# Patient Record
Sex: Female | Born: 1985 | Race: White | Hispanic: No | Marital: Single | Smoking: Former smoker
Health system: Southern US, Community
[De-identification: ages and names within clinical notes are randomized; demographics above are authoritative.]

## PROBLEM LIST (undated history)

## (undated) DIAGNOSIS — D069 Carcinoma in situ of cervix, unspecified: Secondary | ICD-10-CM

## (undated) DIAGNOSIS — I499 Cardiac arrhythmia, unspecified: Secondary | ICD-10-CM

## (undated) DIAGNOSIS — F1910 Other psychoactive substance abuse, uncomplicated: Secondary | ICD-10-CM

## (undated) HISTORY — DX: Carcinoma in situ of cervix, unspecified: D06.9

## (undated) HISTORY — PX: NO PAST SURGERIES: SHX2092

---

## 2009-01-18 ENCOUNTER — Ambulatory Visit: Payer: Self-pay | Admitting: Internal Medicine

## 2010-05-10 ENCOUNTER — Ambulatory Visit: Payer: Self-pay | Admitting: Internal Medicine

## 2011-10-06 ENCOUNTER — Ambulatory Visit: Payer: Self-pay

## 2011-12-23 ENCOUNTER — Ambulatory Visit: Payer: Self-pay | Admitting: Internal Medicine

## 2013-11-07 ENCOUNTER — Ambulatory Visit: Payer: Self-pay

## 2014-02-09 ENCOUNTER — Ambulatory Visit: Payer: Self-pay

## 2014-12-03 ENCOUNTER — Emergency Department: Payer: Self-pay | Admitting: Internal Medicine

## 2014-12-12 ENCOUNTER — Emergency Department: Payer: Self-pay | Admitting: Emergency Medicine

## 2015-10-28 ENCOUNTER — Emergency Department
Admission: EM | Admit: 2015-10-28 | Discharge: 2015-10-28 | Disposition: A | Discharge status: Home / Self Care | Payer: BLUE CROSS/BLUE SHIELD | Attending: Emergency Medicine | Admitting: Emergency Medicine

## 2015-10-28 ENCOUNTER — Encounter: Payer: Self-pay | Admitting: Emergency Medicine

## 2015-10-28 DIAGNOSIS — F17200 Nicotine dependence, unspecified, uncomplicated: Secondary | ICD-10-CM | POA: Diagnosis not present

## 2015-10-28 DIAGNOSIS — N762 Acute vulvitis: Secondary | ICD-10-CM | POA: Diagnosis not present

## 2015-10-28 DIAGNOSIS — N9089 Other specified noninflammatory disorders of vulva and perineum: Secondary | ICD-10-CM | POA: Diagnosis present

## 2015-10-28 MED ORDER — SULFAMETHOXAZOLE-TRIMETHOPRIM 800-160 MG PO TABS: 1.0000 | ORAL_TABLET | Freq: Two times a day (BID) | ORAL | Status: DC

## 2015-10-28 MED ORDER — OXYCODONE-ACETAMINOPHEN 5-325 MG PO TABS: 1.0000 | ORAL_TABLET | Freq: Four times a day (QID) | ORAL | Status: DC | PRN

## 2015-10-28 NOTE — ED Notes (Signed)
Presents with possible abscess area to vagina  Noted swelling about 3 days ago

## 2015-10-28 NOTE — ED Provider Notes (Signed)
[facility] Emergency Department Provider Note ____________________________________________  Time seen: Approximately 10:46 AM  I have reviewed the triage vital signs and the nursing notes.   HISTORY  Chief Complaint Abscess   HPI Isabella Owen is a 29 y.o. female who presents to the emergency department for evaluation of redness and swelling to the right labia. Symptoms started 3 days ago and are progressively worsening. She denies having a history of skin infection or abscesses. She denies fever. No relief with ibuprofen.  History reviewed. No pertinent past medical history.  There are no active problems to display for this patient.   History reviewed. No pertinent past surgical history.  Current Outpatient Rx  Name  Route  Sig  Dispense  Refill  . oxyCODONE-acetaminophen (ROXICET) 5-325 MG tablet   Oral   Take 1 tablet by mouth every 6 (six) hours as needed.   9 tablet   0   . sulfamethoxazole-trimethoprim (BACTRIM DS,SEPTRA DS) 800-160 MG tablet   Oral   Take 1 tablet by mouth 2 (two) times daily.   20 tablet   0     Allergies Ceclor and Norco  No family history on file.  Social History Social History  Substance Use Topics  . Smoking status: Current Every Day Smoker  . Smokeless tobacco: None  . Alcohol Use: No    Review of Systems   Constitutional: No fever/chills Eyes: No visual changes. ENT: No congestion or rhinorrhea Cardiovascular: Denies chest pain. Respiratory: Denies shortness of breath. Gastrointestinal: No abdominal pain.  No nausea, no vomiting.  No diarrhea.  No constipation. Genitourinary: Negative for dysuria. Musculoskeletal: Negative for back pain. Skin: swelling and tenderness to right vaginal area. Neurological: Negative for headaches, focal weakness or numbness.  10-point ROS otherwise negative.  ____________________________________________   PHYSICAL EXAM:  VITAL SIGNS: ED Triage Vitals   Enc Vitals Group     BP 10/28/15 0947 142/84 mmHg     Pulse Rate 10/28/15 0947 92     Resp 10/28/15 0947 16     Temp 10/28/15 0947 98.4 F (36.9 C)     Temp Source 10/28/15 0947 Oral     SpO2 10/28/15 0947 98 %     Weight 10/28/15 0947 130 lb (58.968 kg)     Height 10/28/15 0947  (1.702 m)     Head Cir --      Peak Flow --      Pain Score 10/28/15 0944 6     Pain Loc --      Pain Edu? --      Excl. in GC? --    Constitutional: Alert and oriented. Well appearing and in no acute distress. Eyes: Conjunctivae are normal. PERRL. EOMI. Head: Atraumatic. Nose: No congestion/rhinnorhea. Mouth/Throat: Mucous membranes are moist.  Oropharynx non-erythematous. No oral lesions. Neck: No stridor. Cardiovascular: Normal rate, regular rhythm.  Good peripheral circulation. Respiratory: Normal respiratory effort.  No retractions. Lungs CTAB. Gastrointestinal: Soft and nontender. No distention. No abdominal bruits.  Musculoskeletal: No lower extremity tenderness nor edema.  No joint effusions. Neurologic:  Normal speech and language. No gross focal neurologic deficits are appreciated. Speech is normal. No gait instability. Skin:  Mild erythema and swelling to the right labia majora with local induration. No fluctuance or focal abscess noted. Negative for petechiae.  Psychiatric: Mood and affect are normal. Speech and behavior are normal.  ____________________________________________   LABS (all labs ordered are listed, but only abnormal results are displayed)  Labs Reviewed -  No data to display ____________________________________________  EKG   ____________________________________________  RADIOLOGY  Not indicated. ____________________________________________   PROCEDURES  Procedure(s) performed:  ____________________________________________   INITIAL IMPRESSION / ASSESSMENT AND PLAN / ED COURSE  Pertinent labs & imaging results that were available during my care of  the patient were reviewed by me and considered in my medical decision making (see chart for details).  Patient was advised to take the antibiotic until finished. She was advised to follow-up with primary care or gynecology for symptoms that are not improving over the next 2 days. She was advised to return to the emergency department for symptoms that change or worsen if she is unable schedule an appointment. ____________________________________________   FINAL CLINICAL IMPRESSION(S) / ED DIAGNOSES  Final diagnoses:  Cellulitis of labia majora       Chinita PesterCari B Talha Iser, FNP 10/28/15 1505  Sharyn CreamerMark Quale, MD 10/28/15 [phone]

## 2015-10-30 ENCOUNTER — Emergency Department
Admission: EM | Admit: 2015-10-30 | Discharge: 2015-10-30 | Disposition: A | Discharge status: Home / Self Care | Payer: BLUE CROSS/BLUE SHIELD | Attending: Emergency Medicine | Admitting: Emergency Medicine

## 2015-10-30 ENCOUNTER — Encounter: Payer: Self-pay | Admitting: Emergency Medicine

## 2015-10-30 DIAGNOSIS — N764 Abscess of vulva: Secondary | ICD-10-CM | POA: Diagnosis not present

## 2015-10-30 DIAGNOSIS — F17200 Nicotine dependence, unspecified, uncomplicated: Secondary | ICD-10-CM | POA: Diagnosis not present

## 2015-10-30 DIAGNOSIS — Z792 Long term (current) use of antibiotics: Secondary | ICD-10-CM | POA: Diagnosis not present

## 2015-10-30 MED ORDER — LIDOCAINE-EPINEPHRINE (PF) 1 %-1:200000 IJ SOLN
INTRAMUSCULAR | Status: AC
  Filled 2015-10-30: qty 30

## 2015-10-30 MED ORDER — LIDOCAINE-EPINEPHRINE (PF) 2 %-1:200000 IJ SOLN: 10.0000 mL | Freq: Once | INTRAMUSCULAR | Status: DC

## 2015-10-30 MED ORDER — OXYCODONE-ACETAMINOPHEN 5-325 MG PO TABS
1.0000 | ORAL_TABLET | Freq: Once | ORAL | Status: AC
  Administered 2015-10-30: 1 via ORAL
  Filled 2015-10-30: qty 1

## 2015-10-30 MED ORDER — OXYCODONE-ACETAMINOPHEN 5-325 MG PO TABS: 1.0000 | ORAL_TABLET | ORAL | Status: DC | PRN

## 2015-10-30 NOTE — ED Notes (Signed)
Pt seen on Sunday for vaginal abscess; pt states she was instructed to come back if no improvement within two days.  Pt reports taking Septra as instructed.  No immediate distress at this time.

## 2015-10-30 NOTE — ED Notes (Signed)
Pt reports drainage from abcsess last night; pus-like, green.

## 2015-10-30 NOTE — ED Provider Notes (Signed)
[facility] Emergency Department Provider Note  ____________________________________________  Time seen: Approximately 6:23 PM  I have reviewed the triage vital signs and the nursing notes.   HISTORY  Chief Complaint Abscess  HPI Isabella Owen is a 29 y.o. female is here with complaint of abscess to her labia. Patient states she was here several days ago started on an antibiotic and something for pain. She states that she was hoping that it would go away on its own and actually after heat compresses it opened and drained green and yellow discharge. She states it continues to hurt. She was given Percocet for pain on the last visit and she has one left. Currently she rates her pain as a 9/10.  History reviewed. No pertinent past medical history.  There are no active problems to display for this patient.   History reviewed. No pertinent past surgical history.  Current Outpatient Rx  Name  Route  Sig  Dispense  Refill  . oxyCODONE-acetaminophen (PERCOCET) 5-325 MG tablet   Oral   Take 1 tablet by mouth every 4 (four) hours as needed for severe pain.   20 tablet   0   . sulfamethoxazole-trimethoprim (BACTRIM DS,SEPTRA DS) 800-160 MG tablet   Oral   Take 1 tablet by mouth 2 (two) times daily.   20 tablet   0     Allergies Ceclor and Norco  No family history on file.  Social History Social History  Substance Use Topics  . Smoking status: Current Every Day Smoker  . Smokeless tobacco: None  . Alcohol Use: Yes     Comment: occassionally     Review of Systems Constitutional: No fever/chills Cardiovascular: Denies chest pain. Respiratory: Denies shortness of breath. Gastrointestinal:  No nausea, no vomiting.   Genitourinary: Negative for dysuria. Musculoskeletal: Negative for back pain. Skin: Negative for rash. Positive for abscesses Neurological: Negative for headaches, focal weakness or numbness.  10-point ROS otherwise  negative.  ____________________________________________   PHYSICAL EXAM:  VITAL SIGNS: ED Triage Vitals  Enc Vitals Group     BP 10/30/15 1812 135/87 mmHg     Pulse Rate 10/30/15 1812 85     Resp --      Temp 10/30/15 1812 98 F (36.7 C)     Temp Source 10/30/15 1812 Oral     SpO2 10/30/15 1812 100 %     Weight 10/30/15 1812 130 lb (58.968 kg)     Height 10/30/15 1812  (1.702 m)     Head Cir --      Peak Flow --      Pain Score 10/30/15 1813 9     Pain Loc --      Pain Edu? --      Excl. in GC? --     Constitutional: Alert and oriented. Well appearing and in no acute distress. Eyes: Conjunctivae are normal. PERRL. EOMI. Head: Atraumatic. Nose: No congestion/rhinnorhea. Neck: No stridor.   Cardiovascular: Normal rate, regular rhythm. Grossly normal heart sounds.  Good peripheral circulation. Respiratory: Normal respiratory effort.  No retractions. Lungs CTAB. Gastrointestinal: Soft and nontender. No distention.  Musculoskeletal: No lower extremity tenderness nor edema.  No joint effusions. Neurologic:  Normal speech and language. No gross focal neurologic deficits are appreciated. No gait instability. Skin:  Skin is warm, dry and intact. Right anterior labia with mild erythema and marked tenderness on palpation. There is one area that appears to have drained. Psychiatric: Mood and affect are normal. Speech and  behavior are normal.  ____________________________________________   LABS (all labs ordered are listed, but only abnormal results are displayed)  Labs Reviewed - No data to display  PROCEDURES  Procedure(s) performed: INCISION AND DRAINAGE Performed by: Tommi Rumpshonda L Kylil Swopes Consent: Verbal consent obtained. Risks and benefits: risks, benefits and alternatives were discussed Type: abscess  Body area: right labia   Anesthesia: local infiltration  Incision was made with a scalpel.  Local anesthetic: lidocaine 1 % with epinephrine  Anesthetic  2  cc ction to break up loculations  Drainage: purulent  Drainage amount: Small   Packing  material: 1/4 in iodoform gauze  Patient tolerance: Patient tolerated the procedure well with no immediate complications.    Critical Care performed: No  ____________________________________________   INITIAL IMPRESSION / ASSESSMENT AND PLAN / ED COURSE  Pertinent labs & imaging results that were available during my care of the patient were reviewed by me and considered in my medical decision making (see chart for details).   patient was made aware that she needs to return to the emergency room in 2 days if packing has not already fallen out on its on. Patient is continue taking antibiotics until completely done. She is given a prescription for Percocet as well as one while in the emergency room. She is also encouraged to follow-up with her gynecologist if any continued problems. ____________________________________________   FINAL CLINICAL IMPRESSION(S) / ED DIAGNOSES  Final diagnoses:  Abscess of right genital labia      Tommi RumpsRhonda L Merrisa Skorupski, PA-C 10/30/15 1944  Darien Ramusavid W Kaminski, MD 10/30/15 2214

## 2015-10-30 NOTE — Discharge Instructions (Signed)
Abscess °An abscess (boil or furuncle) is an infected area on or under the skin. This area is filled with yellowish-white fluid (pus) and other material (debris). °HOME CARE  °· Only take medicines as told by your doctor. °· If you were given antibiotic medicine, take it as directed. Finish the medicine even if you start to feel better. °· If gauze is used, follow your doctor's directions for changing the gauze. °· To avoid spreading the infection: °· Keep your abscess covered with a bandage. °· Wash your hands well. °· Do not share personal care items, towels, or whirlpools with others. °· Avoid skin contact with others. °· Keep your skin and clothes clean around the abscess. °· Keep all doctor visits as told. °GET HELP RIGHT AWAY IF:  °· You have more pain, puffiness (swelling), or redness in the wound site. °· You have more fluid or blood coming from the wound site. °· You have muscle aches, chills, or you feel sick. °· You have a fever. °MAKE SURE YOU:  °· Understand these instructions. °· Will watch your condition. °· Will get help right away if you are not doing well or get worse. °  °This information is not intended to replace advice given to you by your health care provider. Make sure you discuss any questions you have with your health care provider. °  °Document Released: 04/28/2008 Document Revised: 05/11/2012 Document Reviewed: 01/24/2012 °Elsevier Interactive Patient Education ©2016 [employer] ° °Incision and Drainage °Incision and drainage is a procedure in which a sac-like structure (cystic structure) is opened and drained. The area to be drained usually contains material such as pus, fluid, or blood.  °LET YOUR CAREGIVER KNOW ABOUT:  °· Allergies to medicine. °· Medicines taken, including vitamins, herbs, eyedrops, over-the-counter medicines, and creams. °· Use of steroids (by mouth or creams). °· Previous problems with anesthetics or numbing medicines. °· History of bleeding problems or blood  clots. °· Previous surgery. °· Other health problems, including diabetes and kidney problems. °· Possibility of pregnancy, if this applies. °RISKS AND COMPLICATIONS °· Pain. °· Bleeding. °· Scarring. °· Infection. °BEFORE THE PROCEDURE  °You may need to have an ultrasound or other imaging tests to see how large or deep your cystic structure is. Blood tests may also be used to determine if you have an infection or how severe the infection is. You may need to have a tetanus shot. °PROCEDURE  °The affected area is cleaned with a cleaning fluid. The cyst area will then be numbed with a medicine (local anesthetic). A small incision will be made in the cystic structure. A syringe or catheter may be used to drain the contents of the cystic structure, or the contents may be squeezed out. The area will then be flushed with a cleansing solution. After cleansing the area, it is often gently packed with a gauze or another wound dressing. Once it is packed, it will be covered with gauze and tape or some other type of wound dressing.  °AFTER THE PROCEDURE  °· Often, you will be allowed to go home right after the procedure. °· You may be given antibiotic medicine to prevent or heal an infection. °· If the area was packed with gauze or some other wound dressing, you will likely need to come back in 1 to 2 days to get it removed. °· The area should heal in about 14 days. °  °This information is not intended to replace advice given to you by your health care provider.   Make sure you discuss any questions you have with your health care provider. °  °Document Released: 05/06/2001 Document Revised: 05/11/2012 Document Reviewed: 01/05/2012 °Elsevier Interactive Patient Education ©2016 [employer] ° °

## 2016-04-05 ENCOUNTER — Emergency Department
Admission: EM | Admit: 2016-04-05 | Discharge: 2016-04-06 | Disposition: A | Discharge status: Home / Self Care | Payer: BLUE CROSS/BLUE SHIELD | Attending: Emergency Medicine | Admitting: Emergency Medicine

## 2016-04-05 ENCOUNTER — Emergency Department: Payer: BLUE CROSS/BLUE SHIELD

## 2016-04-05 ENCOUNTER — Encounter: Payer: Self-pay | Admitting: Radiology

## 2016-04-05 DIAGNOSIS — Z3A12 12 weeks gestation of pregnancy: Secondary | ICD-10-CM | POA: Insufficient documentation

## 2016-04-05 DIAGNOSIS — O200 Threatened abortion: Secondary | ICD-10-CM | POA: Diagnosis not present

## 2016-04-05 DIAGNOSIS — F17200 Nicotine dependence, unspecified, uncomplicated: Secondary | ICD-10-CM | POA: Diagnosis not present

## 2016-04-05 DIAGNOSIS — O209 Hemorrhage in early pregnancy, unspecified: Secondary | ICD-10-CM | POA: Diagnosis present

## 2016-04-05 LAB — ABO/RH: ABO/RH(D): A POS

## 2016-04-05 LAB — CBC
HCT: 37 % (ref 35.0–47.0)
Hemoglobin: 12.5 g/dL (ref 12.0–16.0)
MCH: 31 pg (ref 26.0–34.0)
MCHC: 33.8 g/dL (ref 32.0–36.0)
MCV: 91.7 fL (ref 80.0–100.0)
PLATELETS: 325 10*3/uL (ref 150–440)
RBC: 4.03 MIL/uL (ref 3.80–5.20)
RDW: 12.7 % (ref 11.5–14.5)
WBC: 10.4 10*3/uL (ref 3.6–11.0)

## 2016-04-05 LAB — HCG, QUANTITATIVE, PREGNANCY: hCG, Beta Chain, Quant, S: 1974 m[IU]/mL — ABNORMAL HIGH (ref <5)

## 2016-04-05 NOTE — ED Notes (Signed)
Pt ambulatory to triage with no difficulty. Pt reports she had her LMP on 01/10/16. States her periods are very irregular. States yesterday she took a home pregnancy test that was positive. This evening around 6 pm pt started having vaginal bleeding and states she has had to change her pad about 4 times since then.

## 2016-04-05 NOTE — ED Provider Notes (Signed)
[facility] Emergency Department Provider Note  ____________________________________________    I have reviewed the triage vital signs and the nursing notes.   HISTORY  Chief Complaint Vaginal Bleeding    HPI [facility] is a 30 y.o. female who presents with complaint of vaginal bleeding. Patient reports to the (test yesterday which was positive. She reports breast tenderness over the last few weeks. Last period was on 01/10/2016. She reports her periods are typically irregular. She has had severe bleeding today which started around 6 PM. She denies dizziness. She complains of lower pelvic cramping.     No past medical history on file.  There are no active problems to display for this patient.   No past surgical history on file.  Current Outpatient Rx  Name  Route  Sig  Dispense  Refill  . oxyCODONE-acetaminophen (PERCOCET) 5-325 MG tablet   Oral   Take 1 tablet by mouth every 4 (four) hours as needed for severe pain.   20 tablet   0   . sulfamethoxazole-trimethoprim (BACTRIM DS,SEPTRA DS) 800-160 MG tablet   Oral   Take 1 tablet by mouth 2 (two) times daily.   20 tablet   0     Allergies Ceclor and Norco  No family history on file.  Social History Social History  Substance Use Topics  . Smoking status: Current Every Day Smoker  . Smokeless tobacco: Not on file  . Alcohol Use: Yes     Comment: occassionally     Review of Systems  Constitutional: Negative forDizziness Eyes: Negative for redness  Cardiovascular: Negative for chest pain Respiratory: Negative for shortness of breath. Gastrointestinal: Negative for abdominal pain Genitourinary: Negative for dysuria.Vaginal bleeding as above, pelvic cramping as above Musculoskeletal: Negative for back pain. Skin: Negative for pallor Neurological: Negative for focal weakness Psychiatric: Mild anxiety    ____________________________________________   PHYSICAL  EXAM:  VITAL SIGNS: ED Triage Vitals  Enc Vitals Group     BP 04/05/16 2135 149/89 mmHg     Pulse Rate 04/05/16 2135 94     Resp 04/05/16 2135 18     Temp 04/05/16 2135 98.4 F (36.9 C)     Temp Source 04/05/16 2135 Oral     SpO2 04/05/16 2135 100 %     Weight 04/05/16 2135 135 lb (61.236 kg)     Height 04/05/16 2135 5\' 7"  (1.702 m)     Head Cir --      Peak Flow --      Pain Score 04/05/16 2136 7     Pain Loc --      Pain Edu? --      Excl. in GC? --     Constitutional: Alert and oriented. Well appearing and in no distress.  Eyes: Conjunctivae are normal. No erythema or injection ENT   Head: Normocephalic and atraumatic.   Mouth/Throat: Mucous membranes are moist. Cardiovascular: Normal rate, regular rhythm. Normal and symmetric distal pulses are present in the upper extremities. No murmurs or rubs  Respiratory: Normal respiratory effort without tachypnea nor retractions.  Gastrointestinal: Soft and non-tender in all quadrants. No distention. There is no CVA tenderness. Genitourinary: deferred Musculoskeletal:  No lower extremity tenderness nor edema. Neurologic:  Normal speech and language. No gross focal neurologic deficits are appreciated. Skin:  Skin is warm, dry and intact. No rash noted. Psychiatric: Mood and affect are normal. Patient exhibits appropriate insight and judgment.  ____________________________________________    LABS (pertinent positives/negatives)  Labs Reviewed  HCG, QUANTITATIVE, PREGNANCY  CBC  ABO/RH    ____________________________________________   EKG  None  ____________________________________________    RADIOLOGY  Ultrasound pending  ____________________________________________   PROCEDURES  Procedure(s) performed: none  Critical Care performed: none  ____________________________________________   INITIAL IMPRESSION / ASSESSMENT AND PLAN / ED COURSE  Pertinent labs & imaging results that were available  during my care of the patient were reviewed by me and considered in my medical decision making (see chart for details).  Patient presents with vaginal bleeding, she had a positive pregnancy test yesterday. We will check beta hCG, ABO Rh, and if positive she will receive ultrasound  Beta hCG is elevated, we will obtain ultrasound. Patient is A positive.  Differential includes threatened miscarriage, miscarriage in progress, IUP, ectopic. I have asked Dr. Scotty Court to follow-up on the ultrasound.   ____________________________________________   FINAL CLINICAL IMPRESSION(S) / ED DIAGNOSES  Final diagnoses:  Threatened miscarriage          Jene Every, MD 04/05/16 2311

## 2016-04-06 NOTE — Discharge Instructions (Signed)
Your ultrasound was able to visualize an early pregnancy that appears to be in the uterus.  However, it is too early to be sure. There is still a possibility that you have a pregnancy implanted in an abnormal location which would be nonviable and dangerous. Follow-up with obstetrics in 2 days to establish prenatal care and for repeat beta hCG blood testing and further evaluation to continue monitoring her pregnancy and symptoms.  Your beta-HCG level today is 1974.  Threatened Miscarriage A threatened miscarriage occurs when you have vaginal bleeding during your first 20 weeks of pregnancy but the pregnancy has not ended. If you have vaginal bleeding during this time, your health care provider will do tests to make sure you are still pregnant. If the tests show you are still pregnant and the developing baby (fetus) inside your womb (uterus) is still growing, your condition is considered a threatened miscarriage. A threatened miscarriage does not mean your pregnancy will end, but it does increase the risk of losing your pregnancy (complete miscarriage). CAUSES  The cause of a threatened miscarriage is usually not known. If you go on to have a complete miscarriage, the most common cause is an abnormal number of chromosomes in the developing baby. Chromosomes are the structures inside cells that hold all your genetic material. Some causes of vaginal bleeding that do not result in miscarriage include:  Having sex.  Having an infection.  Normal hormone changes of pregnancy.  Bleeding that occurs when an egg implants in your uterus. RISK FACTORS Risk factors for bleeding in early pregnancy include:  Obesity.  Smoking.  Drinking excessive amounts of alcohol or caffeine.  Recreational drug use. SIGNS AND SYMPTOMS  Light vaginal bleeding.  Mild abdominal pain or cramps. DIAGNOSIS  If you have bleeding with or without abdominal pain before 20 weeks of pregnancy, your health care provider will do  tests to check whether you are still pregnant. One important test involves using sound waves and a computer (ultrasound) to create images of the inside of your uterus. Other tests include an internal exam of your vagina and uterus (pelvic exam) and measurement of your baby's heart rate.  You may be diagnosed with a threatened miscarriage if:  Ultrasound testing shows you are still pregnant.  Your baby's heart rate is strong.  A pelvic exam shows that the opening between your uterus and your vagina (cervix) is closed.  Your heart rate and blood pressure are stable.  Blood tests confirm you are still pregnant. TREATMENT  No treatments have been shown to prevent a threatened miscarriage from going on to a complete miscarriage. However, the right home care is important.  HOME CARE INSTRUCTIONS   Make sure you keep all your appointments for prenatal care. This is very important.  Get plenty of rest.  Do not have sex or use tampons if you have vaginal bleeding.  Do not douche.  Do not smoke or use recreational drugs.  Do not drink alcohol.  Avoid caffeine. SEEK MEDICAL CARE IF:  You have light vaginal bleeding or spotting while pregnant.  You have abdominal pain or cramping.  You have a fever. SEEK IMMEDIATE MEDICAL CARE IF:  You have heavy vaginal bleeding.  You have blood clots coming from your vagina.  You have severe low back pain or abdominal cramps.  You have fever, chills, and severe abdominal pain. MAKE SURE YOU:  Understand these instructions.  Will watch your condition.  Will get help right away if you are not doing  well or get worse.   This information is not intended to replace advice given to you by your health care provider. Make sure you discuss any questions you have with your health care provider.   Document Released: 11/10/2005 Document Revised: 11/15/2013 Document Reviewed: 09/06/2013 Elsevier Interactive Patient Education 2016 Elsevier  Inc.  Vaginal Bleeding During Pregnancy, First Trimester A small amount of bleeding (spotting) from the vagina is relatively common in early pregnancy. It usually stops on its own. Various things may cause bleeding or spotting in early pregnancy. Some bleeding may be related to the pregnancy, and some may not. In most cases, the bleeding is normal and is not a problem. However, bleeding can also be a sign of something serious. Be sure to tell your health care provider about any vaginal bleeding right away. Some possible causes of vaginal bleeding during the first trimester include:  Infection or inflammation of the cervix.  Growths (polyps) on the cervix.  Miscarriage or threatened miscarriage.  Pregnancy tissue has developed outside of the uterus and in a fallopian tube (tubal pregnancy).  Tiny cysts have developed in the uterus instead of pregnancy tissue (molar pregnancy). HOME CARE INSTRUCTIONS  Watch your condition for any changes. The following actions may help to lessen any discomfort you are feeling:  Follow your health care provider's instructions for limiting your activity. If your health care provider orders bed rest, you may need to stay in bed and only get up to use the bathroom. However, your health care provider may allow you to continue light activity.  If needed, make plans for someone to help with your regular activities and responsibilities while you are on bed rest.  Keep track of the number of pads you use each day, how often you change pads, and how soaked (saturated) they are. Write this down.  Do not use tampons. Do not douche.  Do not have sexual intercourse or orgasms until approved by your health care provider.  If you pass any tissue from your vagina, save the tissue so you can show it to your health care provider.  Only take over-the-counter or prescription medicines as directed by your health care provider.  Do not take aspirin because it can make you  bleed.  Keep all follow-up appointments as directed by your health care provider. SEEK MEDICAL CARE IF:  You have any vaginal bleeding during any part of your pregnancy.  You have cramps or labor pains.  You have a fever, not controlled by medicine. SEEK IMMEDIATE MEDICAL CARE IF:   You have severe cramps in your back or belly (abdomen).  You pass large clots or tissue from your vagina.  Your bleeding increases.  You feel light-headed or weak, or you have fainting episodes.  You have chills.  You are leaking fluid or have a gush of fluid from your vagina.  You pass out while having a bowel movement. MAKE SURE YOU:  Understand these instructions.  Will watch your condition.  Will get help right away if you are not doing well or get worse.   This information is not intended to replace advice given to you by your health care provider. Make sure you discuss any questions you have with your health care provider.   Document Released: 08/20/2005 Document Revised: 11/15/2013 Document Reviewed: 07/18/2013 Elsevier Interactive Patient Education [employer]

## 2016-04-06 NOTE — ED Provider Notes (Signed)
Ultrasound shows gestational sac with decidual reaction but no definitive yolk sac or embryo visualized. There is a corpus luteum cyst. Likely this is an IUP with threatened miscarriage, but I have counseled the patient on possible ectopic and advised her to follow up with obstetrics in 2 days for further evaluation, monitoring of her symptoms, and repeat beta hCG. She is Rh+ and does not require Carro[facility] Meiers Graham. Vital signs are normal, abdomen is benign.  Sharman CheekPhillip Sladen Plancarte, MD 04/06/16 [phone]

## 2016-06-05 IMAGING — CR RIGHT HAND - COMPLETE 3+ VIEW
1 series · 3 of 3 positions shown · non-contrast
Comparison: Right wrist radiographs 10/06/2011.

CLINICAL DATA: 28-year-old female with medial right hand pain after
falling this morning.

EXAM:
RIGHT HAND - COMPLETE 3+ VIEW

[Series 1: pa · 0.17mm/px · 3 of 3 slices shown]
[im 1/3]
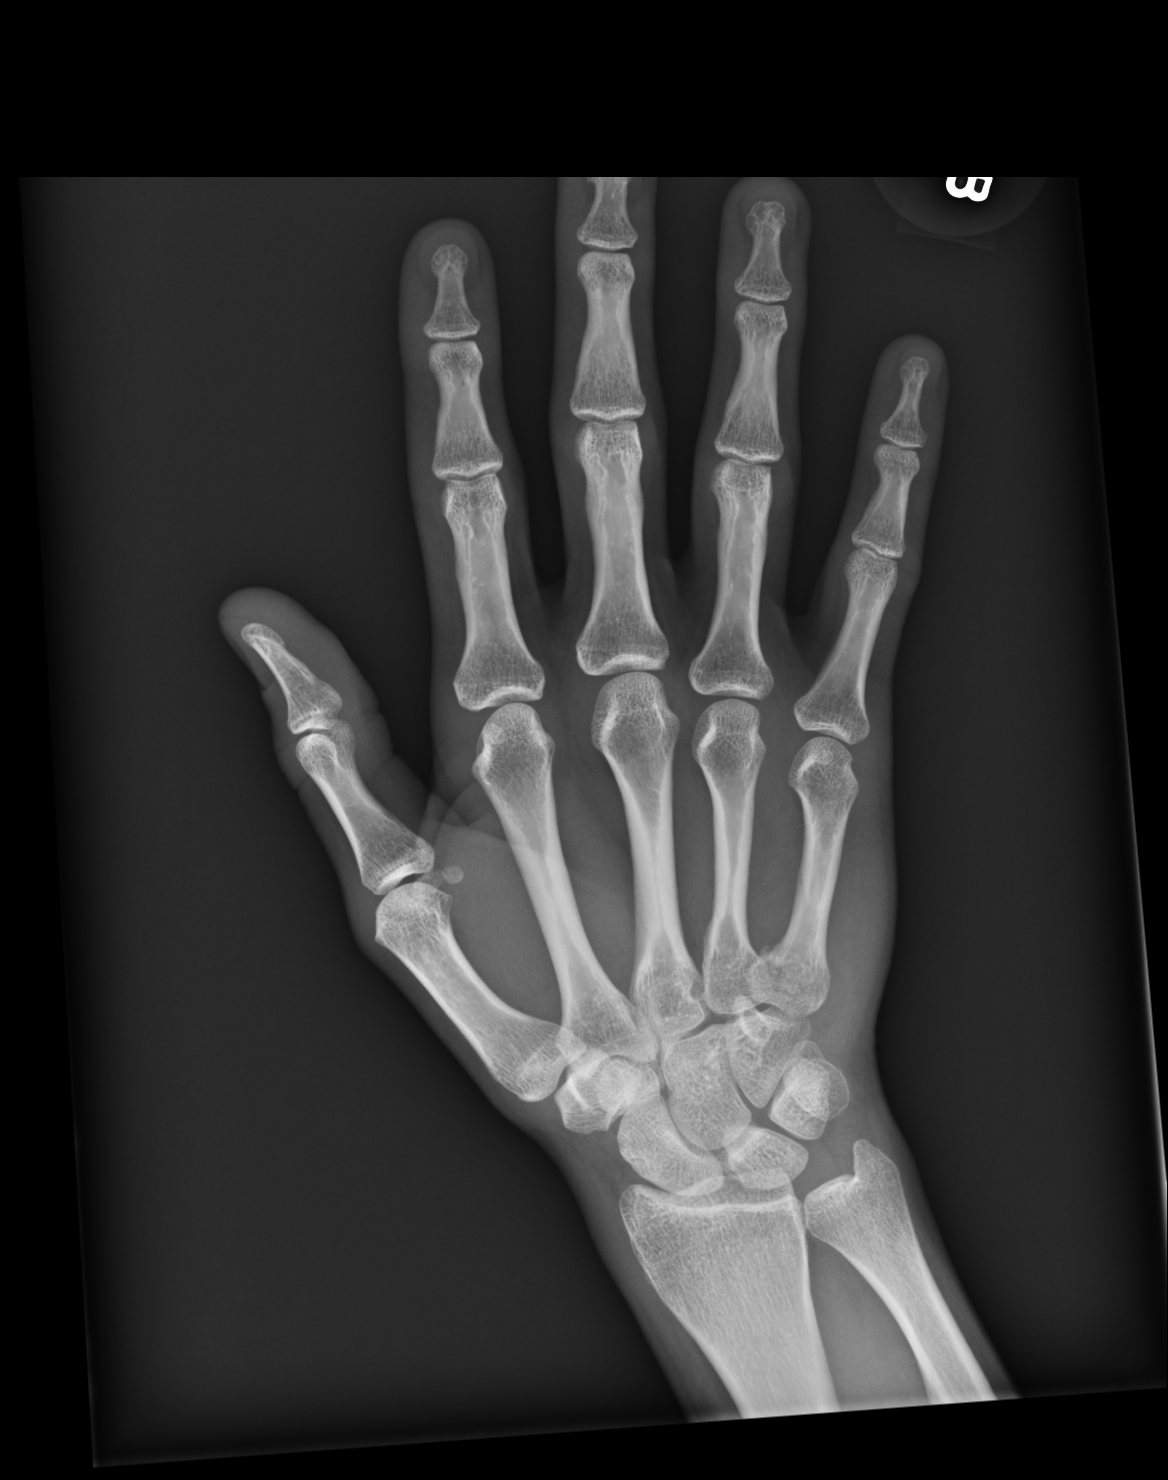
[im 2/3]
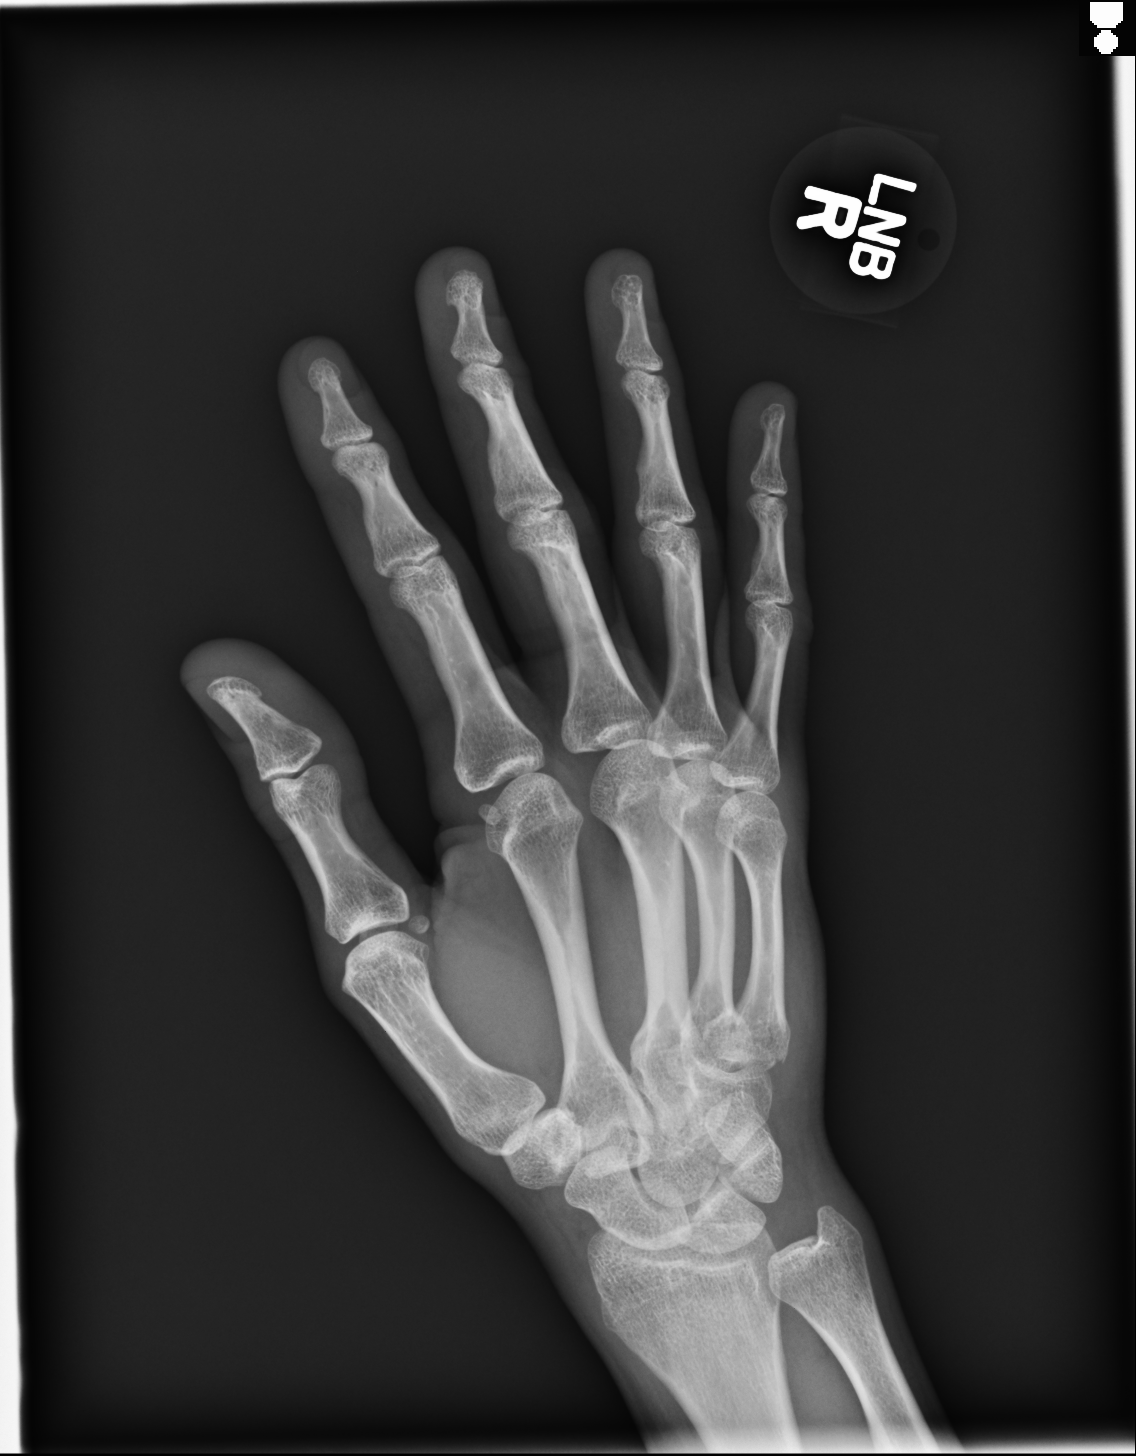
[im 3/3]
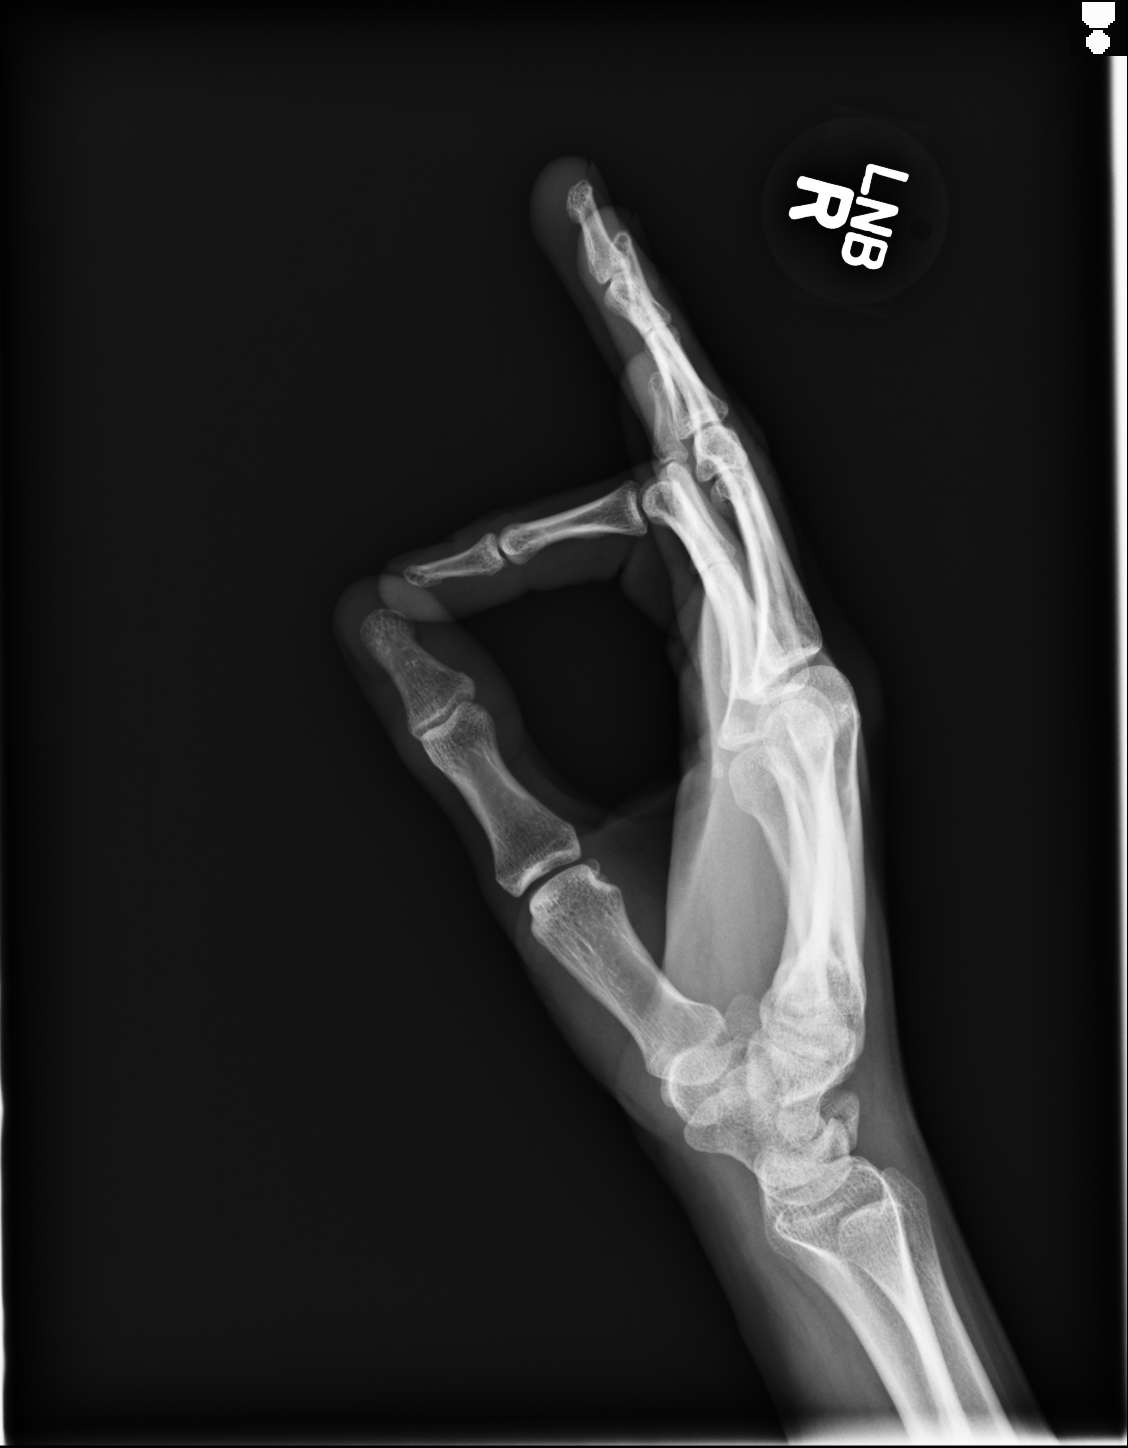

[3 of 3 positions shown; findings below may reference images not displayed]

FINDINGS: Subtle minimally displaced (approximately 1-2 mm dorsally) fracture
through the base of the fifth metacarpal, with probable
intra-articular extension. Overlying soft tissue swelling is noted.
No other acute displaced fracture, subluxation or dislocation is
noted.
IMPRESSION: 1. Subtle minimally dorsally displaced potentially intra-articular
fracture through the base of the fifth metacarpal.

## 2016-11-24 NOTE — L&D Delivery Note (Signed)
Delivery Note At 4:10 PM a viable female was delivered via Vaginal, Spontaneous Delivery (Presentation: OA).  APGAR: 8, 9; weight pending.   Placenta status: spontaneously, intact.  Cord: 3VC with the following complications: none.  Cord pH: n/a  Anesthesia:  epidural Episiotomy: None Lacerations: None Suture Repair: n/a Est. Blood Loss (mL): 300  Mom to postpartum.  Baby to Couplet care / Skin to Skin.  Called to see patient.  Mom pushed to deliver a viable female infant.  The head followed by shoulders, which delivered without difficulty, and the rest of the body.  No nuchal cord noted.  Baby to mom's chest.  Cord clamped and cut after > 1 min delay.  No cord blood obtained.  Placenta delivered spontaneously, intact, with a 3-vessel cord.  No vaginal, cervical, or perineal lacerations. All counts correct.  Hemostasis obtained with IV pitocin and fundal massage. EBL 300 mL.     Thomasene Mohair, MD 08/15/2017, 4:24 PM

## 2017-03-04 ENCOUNTER — Other Ambulatory Visit: Payer: Medicaid Other

## 2017-03-04 ENCOUNTER — Encounter: Payer: Self-pay | Admitting: Advanced Practice Midwife

## 2017-03-04 ENCOUNTER — Ambulatory Visit (INDEPENDENT_AMBULATORY_CARE_PROVIDER_SITE_OTHER): Payer: Medicaid Other | Admitting: Advanced Practice Midwife

## 2017-03-04 VITALS — BP 110/70 | HR 88 | Wt 144.0 lb

## 2017-03-04 DIAGNOSIS — Z113 Encounter for screening for infections with a predominantly sexual mode of transmission: Secondary | ICD-10-CM

## 2017-03-04 DIAGNOSIS — Z369 Encounter for antenatal screening, unspecified: Secondary | ICD-10-CM

## 2017-03-04 DIAGNOSIS — Z3492 Encounter for supervision of normal pregnancy, unspecified, second trimester: Secondary | ICD-10-CM

## 2017-03-04 DIAGNOSIS — O0990 Supervision of high risk pregnancy, unspecified, unspecified trimester: Secondary | ICD-10-CM

## 2017-03-04 NOTE — Progress Notes (Addendum)
Here for NOB and able to have growth/dating scan today. EDD will stay with LMP. Recommended beginning baby ASA for hx of preeclampsia. Sample of diclegis given for continuing N&V. Anatomy scan in 4 weeks. Also needs baseline preeclampsia labs at [facility]

## 2017-03-04 NOTE — Progress Notes (Signed)
New Obstetric Patient H&P    Chief Complaint: "Desires prenatal care"   History of Present Illness: Patient is a 31 y.o. [id number] Not Hispanic or Latino female, LMP 11/17/2016 presents with amenorrhea and positive home pregnancy test. Based on her  LMP, her EDD is Estimated Date of Delivery: 08/24/17 and her EGA is 104w2d. Cycles are 5. days, irregular, and occur approximately every : 1-2 months days. Her last pap smear was 10 months ago and was no abnormalities.    She had a urine pregnancy test which was positive 2 month(s)  ago. Her last menstrual period was normal and lasted for  5 day(s). Since her LMP she claims she has experienced breast tenderness, fatigue, nausea, vomiting. She denies vaginal bleeding. Her past medical history is contibutory. She is on suboxone for prior addiction. Her prior pregnancies are notable for pre-eclampsia  Since her LMP, she admits to the use of tobacco products  no She claims she has lost 3 pounds since the start of her pregnancy.  There are cats in the home in the home  no  She admits close contact with children on a regular basis  yes  She has had chicken pox in the past yes She has had Tuberculosis exposures, symptoms, or previously tested positive for TB   no Current or past history of domestic violence. no  Genetic Screening/Teratology Counseling: (Includes patient, baby's father, or anyone in either family with:)   1. Patient's age >/= 2 at [facility]  no 2. Thalassemia ([place], [place], Mediterranean, or Asian background): MCV<80  no 3. Neural tube defect (meningomyelocele, spina bifida, anencephaly)  no 4. Congenital heart defect  no  5. Down syndrome  no 6. Tay-Sachs (Jewish, [place]  no 7. Canavan's Disease  no 8. Sickle cell disease or trait (African)  no  9. Hemophilia or other blood disorders  no  10. Muscular dystrophy  no  11. Cystic fibrosis  no  12. Huntington's Chorea  no  13. Mental retardation/autism  no 14. Other  inherited genetic or chromosomal disorder  no 15. Maternal metabolic disorder (DM, PKU, etc)  no 16. Patient or FOB with a child with a birth defect not listed above no  16a. Patient or FOB with a birth defect themselves no 17. Recurrent pregnancy loss, or stillbirth  no  18. Any medications since LMP other than prenatal vitamins (include vitamins, supplements, OTC meds, drugs, alcohol)  Yes suboxone 19. Any other genetic/environmental exposure to discuss  no  Infection History:   1. Lives with someone with TB or TB exposed  no  2. Patient or partner has history of genital herpes  no 3. Rash or viral illness since LMP  no 4. History of STI (GC, CT, HPV, syphilis, HIV)  no 5. History of recent travel :  no  Other pertinent information:  yes confirmation of pregnancy letter given today so she can switch from suboxone to subutex. [facility] care is followed at [facility]    Review of Systems:10 point review of systems negative unless otherwise noted in HPI  Past Medical History:  History reviewed. No pertinent past medical history.  Past Surgical History:  History reviewed. No pertinent surgical history.  Gynecologic History: Patient's last menstrual period was 11/17/2016.  Obstetric History: G3P1011  Family History:  History reviewed. No pertinent family history.  Social History:  Social History   Social History  . Marital status: Single    Spouse name: N/A  . Number of  children: N/A  . Years of education: N/A   Occupational History  . Not on file.   Social History Main Topics  . Smoking status: Former Games developer  . Smokeless tobacco: Never Used  . Alcohol use No     Comment: occassionally   . Drug use: No     Comment: clean for 1 year  . Sexual activity: Yes    Birth control/ protection: None   Other Topics Concern  . Not on file   Social History Narrative  . No narrative on file    Allergies:  Allergies  Allergen Reactions  . Ceclor [Cefaclor] Rash  . Hydrocodone  Rash  . Norco [Hydrocodone-Acetaminophen] Rash    Medications: Prior to Admission medications   Medication Sig Start Date End Date Taking? Authorizing Provider  SUBOXONE 8-2 MG FILM DISSOLVE 1 FILM UNT BID 02/26/17   Historical Provider, MD    Physical Exam Vitals: Blood pressure 110/70, pulse 88, weight 144 lb (65.3 kg), last menstrual period 11/17/2016.  General: NAD HEENT: normocephalic, anicteric Thyroid: no enlargement, no palpable nodules Pulmonary: No increased work of breathing, CTAB Cardiovascular: RRR, distal pulses 2+ Abdomen: NABS, soft, non-tender, non-distended.  Umbilicus without lesions.  No hepatomegaly, splenomegaly or masses palpable. No evidence of hernia  Genitourinary:  External: Normal external female genitalia.  Normal urethral meatus, normal  Bartholin's and Skene's glands.    Vagina: Normal vaginal mucosa, no evidence of prolapse.    Cervix: Grossly normal in appearance, no bleeding, no CMT  Uterus: Enlarged, mobile, normal contour.    Adnexa: ovaries non-enlarged, no adnexal masses  Rectal: deferred Extremities: no edema, erythema, or tenderness Neurologic: Grossly intact Psychiatric: mood appropriate, affect full   Assessment: 31 y.o. G3P0011 at [redacted]w[redacted]d presenting to initiate prenatal care  Plan: 1) Avoid alcoholic beverages. 2) Patient encouraged not to smoke.  3) Discontinue the use of all non-medicinal drugs and chemicals.  4) Take prenatal vitamins daily.  5) Nutrition, food safety (fish, cheese advisories, and high nitrite foods) and exercise discussed. 6) Hospital and practice style discussed with cross coverage system.  7) Genetic Screening, such as with 1st Trimester Screening, cell free fetal DNA, AFP testing, and Ultrasound, as well as with amniocentesis and CVS as appropriate, is discussed with patient. At the conclusion of today's visit patient requested genetic testing 8) Patient is asked about travel to areas at risk for the Zika virus,  and counseled to avoid travel and exposure to mosquitoes or sexual partners who may have themselves been exposed to the virus. Testing is discussed, and will be ordered as appropriate.    Tresea Mall, CNM

## 2017-03-05 LAB — RPR+RH+ABO+RUB AB+AB SCR+CB...
Antibody Screen: NEGATIVE
HEMATOCRIT: 36.6 % (ref 34.0–46.6)
HEMOGLOBIN: 12.4 g/dL (ref 11.1–15.9)
HIV Screen 4th Generation wRfx: NONREACTIVE
Hepatitis B Surface Ag: NEGATIVE
MCH: 30 pg (ref 26.6–33.0)
MCHC: 33.9 g/dL (ref 31.5–35.7)
MCV: 88 fL (ref 79–97)
Platelets: 311 10*3/uL (ref 150–379)
RBC: 4.14 x10E6/uL (ref 3.77–5.28)
RDW: 14.5 % (ref 12.3–15.4)
RH TYPE: POSITIVE
RPR Ser Ql: NONREACTIVE
Rubella Antibodies, IGG: 2.87 index (ref >0.99)
Varicella zoster IgG: 428 index (ref >165)
WBC: 10.7 10*3/uL (ref 3.4–10.8)

## 2017-03-06 LAB — GC/CHLAMYDIA PROBE AMP
Chlamydia trachomatis, NAA: NEGATIVE
Neisseria gonorrhoeae by PCR: NEGATIVE

## 2017-03-06 LAB — URINE CULTURE: Organism ID, Bacteria: NO GROWTH

## 2017-03-19 ENCOUNTER — Other Ambulatory Visit: Payer: Self-pay

## 2017-03-19 MED ORDER — DOXYLAMINE-PYRIDOXINE 10-10 MG PO TBEC: 2.0000 | DELAYED_RELEASE_TABLET | Freq: Every day | ORAL | 5 refills | Status: DC

## 2017-03-19 NOTE — Telephone Encounter (Signed)
Pt called needs rx for diclegis

## 2017-03-19 NOTE — Telephone Encounter (Signed)
Rx done, to [facility]

## 2017-03-20 NOTE — Telephone Encounter (Signed)
Pt aware.

## 2017-04-01 ENCOUNTER — Ambulatory Visit (INDEPENDENT_AMBULATORY_CARE_PROVIDER_SITE_OTHER): Payer: Medicaid Other | Admitting: Obstetrics and Gynecology

## 2017-04-01 ENCOUNTER — Other Ambulatory Visit (INDEPENDENT_AMBULATORY_CARE_PROVIDER_SITE_OTHER): Payer: Medicaid Other

## 2017-04-01 VITALS — BP 112/72 | Wt 150.0 lb

## 2017-04-01 DIAGNOSIS — O0990 Supervision of high risk pregnancy, unspecified, unspecified trimester: Secondary | ICD-10-CM

## 2017-04-01 DIAGNOSIS — Z048 Encounter for examination and observation for other specified reasons: Secondary | ICD-10-CM

## 2017-04-01 DIAGNOSIS — F1911 Other psychoactive substance abuse, in remission: Secondary | ICD-10-CM | POA: Insufficient documentation

## 2017-04-01 DIAGNOSIS — O09299 Supervision of pregnancy with other poor reproductive or obstetric history, unspecified trimester: Secondary | ICD-10-CM | POA: Insufficient documentation

## 2017-04-01 DIAGNOSIS — O99320 Drug use complicating pregnancy, unspecified trimester: Secondary | ICD-10-CM

## 2017-04-01 DIAGNOSIS — IMO0002 Reserved for concepts with insufficient information to code with codable children: Secondary | ICD-10-CM

## 2017-04-01 DIAGNOSIS — Z0489 Encounter for examination and observation for other specified reasons: Secondary | ICD-10-CM

## 2017-04-01 DIAGNOSIS — Z3A19 19 weeks gestation of pregnancy: Secondary | ICD-10-CM

## 2017-04-01 NOTE — Patient Instructions (Addendum)
Start low dose ASA at >[redacted] weeks gestation as per USPTF recommendation "Low-Dose Aspirin Use for the Prevention of Morbidity and Mortality From Preeclampsia: Preventive Medicine"  furthermore endorsed by ACOG, WHO, and NIH based on evidence level B for the prevention of preeclampsia  In women deemed high risk  (diabetes, renal disease, chronic hypertension, history of preeclampsia in prior gestation, autoimmune diseases, or multifetal gestations)  

## 2017-04-01 NOTE — Progress Notes (Signed)
Anatomy scan today

## 2017-04-02 ENCOUNTER — Encounter: Payer: Self-pay | Admitting: Obstetrics and Gynecology

## 2017-04-02 LAB — PROTEIN / CREATININE RATIO, URINE
Creatinine, Urine: 154.9 mg/dL
PROTEIN/CREAT RATIO: 76 mg/g{creat} (ref 0–200)
Protein, Ur: 11.8 mg/dL

## 2017-04-02 LAB — COMPREHENSIVE METABOLIC PANEL
ALT: 13 IU/L (ref 0–32)
AST: 11 IU/L (ref 0–40)
Albumin/Globulin Ratio: 1.5 (ref 1.2–2.2)
Albumin: 3.6 g/dL (ref 3.5–5.5)
Alkaline Phosphatase: 62 IU/L (ref 39–117)
BUN/Creatinine Ratio: 10 (ref 9–23)
BUN: 4 mg/dL — ABNORMAL LOW (ref 6–20)
Bilirubin Total: <0.2 mg/dL (ref 0.0–1.2)
CALCIUM: 8.3 mg/dL — AB (ref 8.7–10.2)
CO2: 21 mmol/L (ref 18–29)
CREATININE: 0.39 mg/dL — AB (ref 0.57–1.00)
Chloride: 102 mmol/L (ref 96–106)
GFR, EST AFRICAN AMERICAN: 164 mL/min/{1.73_m2} (ref >59)
GFR, EST NON AFRICAN AMERICAN: 142 mL/min/{1.73_m2} (ref >59)
GLOBULIN, TOTAL: 2.4 g/dL (ref 1.5–4.5)
Glucose: 63 mg/dL — ABNORMAL LOW (ref 65–99)
Potassium: 3.8 mmol/L (ref 3.5–5.2)
Sodium: 139 mmol/L (ref 134–144)
TOTAL PROTEIN: 6 g/dL (ref 6.0–8.5)

## 2017-04-28 ENCOUNTER — Other Ambulatory Visit: Payer: Self-pay | Admitting: Advanced Practice Midwife

## 2017-04-28 DIAGNOSIS — Z0489 Encounter for examination and observation for other specified reasons: Secondary | ICD-10-CM

## 2017-04-28 DIAGNOSIS — IMO0002 Reserved for concepts with insufficient information to code with codable children: Secondary | ICD-10-CM

## 2017-04-29 ENCOUNTER — Ambulatory Visit (INDEPENDENT_AMBULATORY_CARE_PROVIDER_SITE_OTHER): Payer: Medicaid Other

## 2017-04-29 ENCOUNTER — Ambulatory Visit (INDEPENDENT_AMBULATORY_CARE_PROVIDER_SITE_OTHER): Payer: Medicaid Other | Admitting: Obstetrics & Gynecology

## 2017-04-29 VITALS — BP 110/60 | Wt 153.0 lb

## 2017-04-29 DIAGNOSIS — IMO0002 Reserved for concepts with insufficient information to code with codable children: Secondary | ICD-10-CM

## 2017-04-29 DIAGNOSIS — Z0489 Encounter for examination and observation for other specified reasons: Secondary | ICD-10-CM

## 2017-04-29 DIAGNOSIS — Z048 Encounter for examination and observation for other specified reasons: Secondary | ICD-10-CM | POA: Diagnosis not present

## 2017-04-29 DIAGNOSIS — Z3A23 23 weeks gestation of pregnancy: Secondary | ICD-10-CM

## 2017-04-29 DIAGNOSIS — O99320 Drug use complicating pregnancy, unspecified trimester: Secondary | ICD-10-CM

## 2017-04-29 NOTE — Addendum Note (Signed)
Addended by: Nadara MustardHARRIS, Reade Trefz P on: 04/29/2017 10:33 AM   Modules accepted: Orders

## 2017-04-29 NOTE — Progress Notes (Signed)
PNV, US discussed (anat complete), still on Subutex, NAS info gv, UDS today, Glucola nv

## 2017-04-29 NOTE — Patient Instructions (Signed)

## 2017-04-30 LAB — DRUG SCREEN, URINE
AMPHETAMINES, URINE: NEGATIVE ng/mL
BARBITURATE SCREEN URINE: NEGATIVE ng/mL
Benzodiazepine Quant, Ur: NEGATIVE ng/mL
Cannabinoid Quant, Ur: POSITIVE ng/mL
Cocaine (Metab.): NEGATIVE ng/mL
OPIATE QUANT UR: NEGATIVE ng/mL
PCP Quant, Ur: NEGATIVE ng/mL

## 2017-05-29 ENCOUNTER — Ambulatory Visit (INDEPENDENT_AMBULATORY_CARE_PROVIDER_SITE_OTHER): Payer: Medicaid Other | Admitting: Advanced Practice Midwife

## 2017-05-29 ENCOUNTER — Other Ambulatory Visit: Payer: Medicaid Other

## 2017-05-29 VITALS — BP 114/70 | Wt 160.0 lb

## 2017-05-29 DIAGNOSIS — Z3A23 23 weeks gestation of pregnancy: Secondary | ICD-10-CM

## 2017-05-29 DIAGNOSIS — Z3A27 27 weeks gestation of pregnancy: Secondary | ICD-10-CM

## 2017-05-29 DIAGNOSIS — S62309A Unspecified fracture of unspecified metacarpal bone, initial encounter for closed fracture: Secondary | ICD-10-CM | POA: Insufficient documentation

## 2017-05-29 NOTE — Progress Notes (Signed)
1hr gtt today.  

## 2017-05-29 NOTE — Progress Notes (Signed)
Still having N&V some days. Diclegis is helping. Good fetal movement. No LOF, VB, Ctx's.

## 2017-05-30 LAB — 28 WEEK RH+PANEL
BASOS: 0 %
Basophils Absolute: 0 10*3/uL (ref 0.0–0.2)
EOS (ABSOLUTE): 0.1 10*3/uL (ref 0.0–0.4)
Eos: 1 %
GESTATIONAL DIABETES SCREEN: 104 mg/dL (ref 65–139)
HEMATOCRIT: 32.3 % — AB (ref 34.0–46.6)
HEMOGLOBIN: 10.7 g/dL — AB (ref 11.1–15.9)
HIV Screen 4th Generation wRfx: NONREACTIVE
IMMATURE GRANS (ABS): 0 10*3/uL (ref 0.0–0.1)
Immature Granulocytes: 0 %
LYMPHS ABS: 1.6 10*3/uL (ref 0.7–3.1)
Lymphs: 12 %
MCH: 30.3 pg (ref 26.6–33.0)
MCHC: 33.1 g/dL (ref 31.5–35.7)
MCV: 92 fL (ref 79–97)
MONOS ABS: 1.3 10*3/uL — AB (ref 0.1–0.9)
Monocytes: 10 %
NEUTROS ABS: 9.6 10*3/uL — AB (ref 1.4–7.0)
Neutrophils: 77 %
Platelets: 323 10*3/uL (ref 150–379)
RBC: 3.53 x10E6/uL — AB (ref 3.77–5.28)
RDW: 13 % (ref 12.3–15.4)
RPR: NONREACTIVE
WBC: 12.7 10*3/uL — ABNORMAL HIGH (ref 3.4–10.8)

## 2017-06-06 IMAGING — US US OB COMP LESS 14 WK
1 series · 13 of 28 positions shown · non-contrast
Comparison: None.

CLINICAL DATA: Vaginal bleeding and lower abdominal cramping for 5
hours. Gestational age by last menstrual period 12 weeks and 2 days.

EXAM:
OBSTETRIC <14 WK US AND TRANSVAGINAL OB US
TECHNIQUE: Both transabdominal and transvaginal ultrasound examinations were
performed for complete evaluation of the gestation as well as the
maternal uterus, adnexal regions, and pelvic cul-de-sac.
Transvaginal technique was performed to assess early pregnancy.

[Series 1: us ob comp less 14 wk · 0.19mm/px · 13 of 97 slices shown]
[im 4/97]
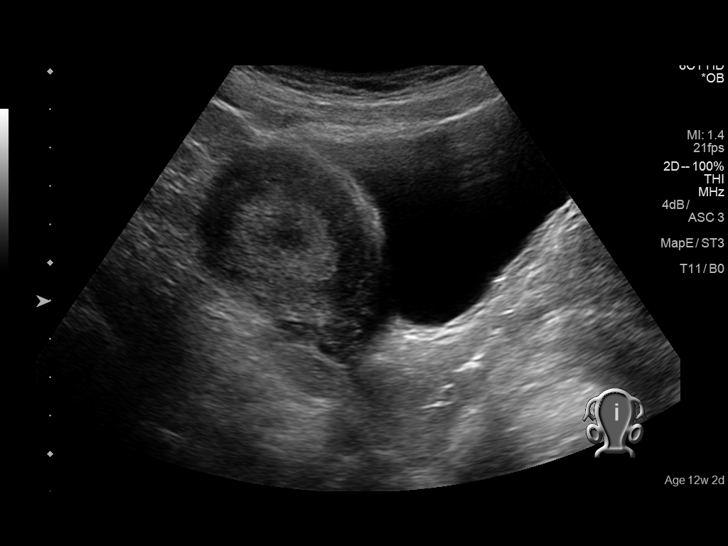
[im 11/97]
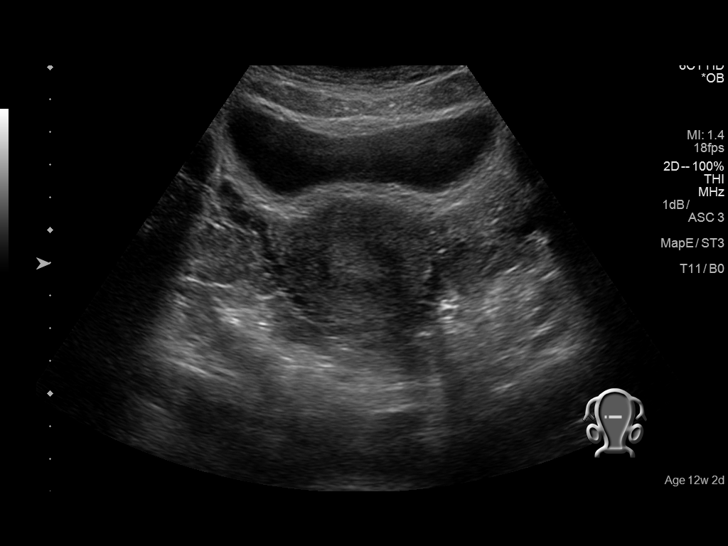
[im 18/97]
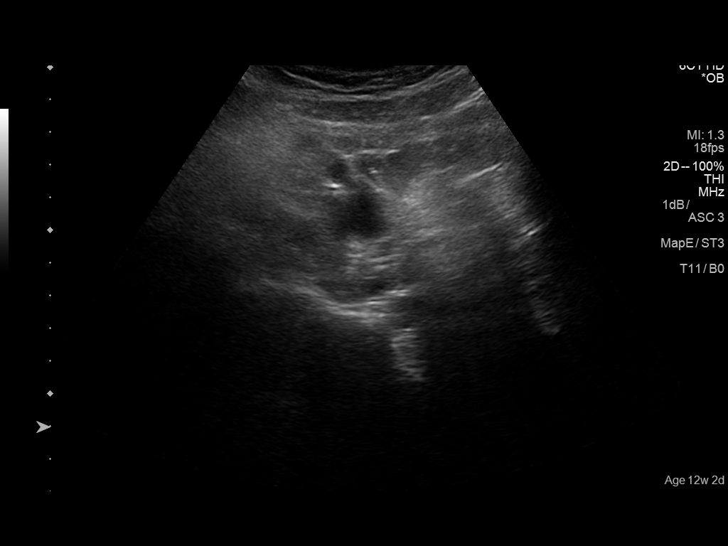
[im 25/97]
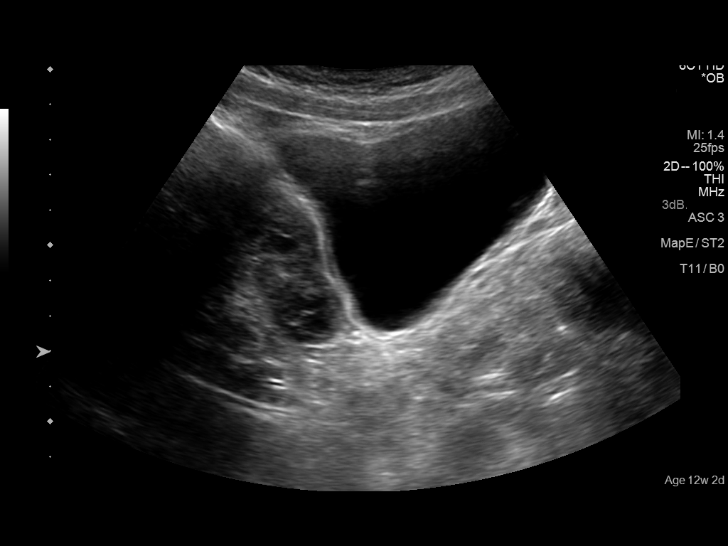
[im 33/97]
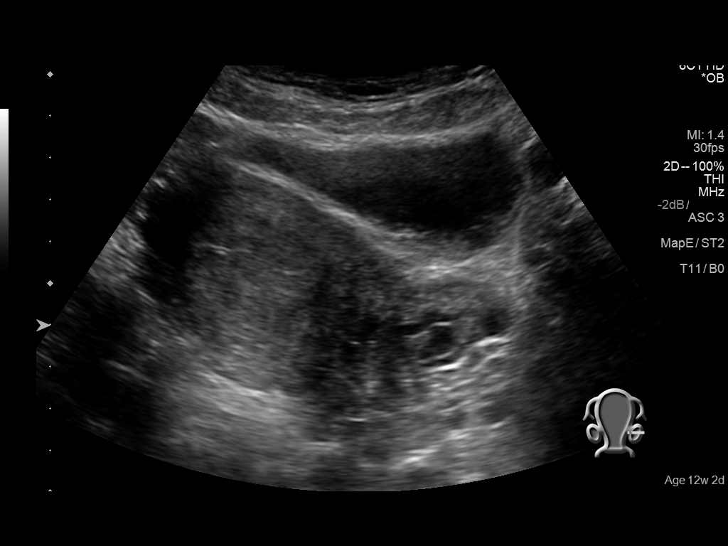
[im 40/97]
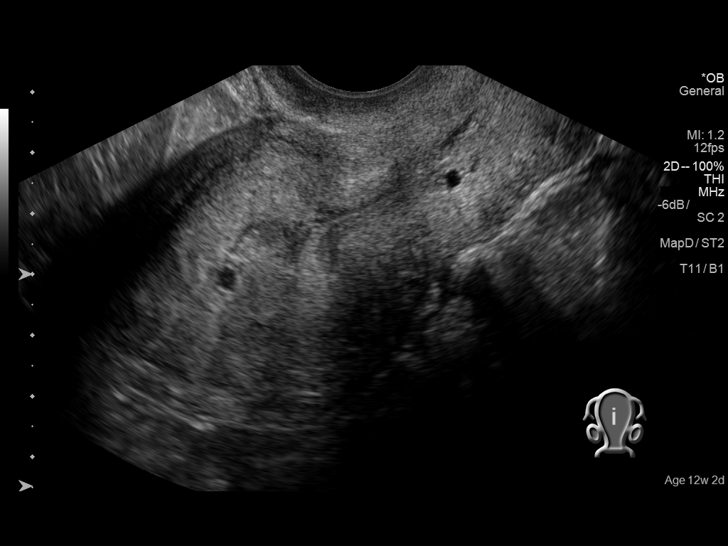
[im 50/97]
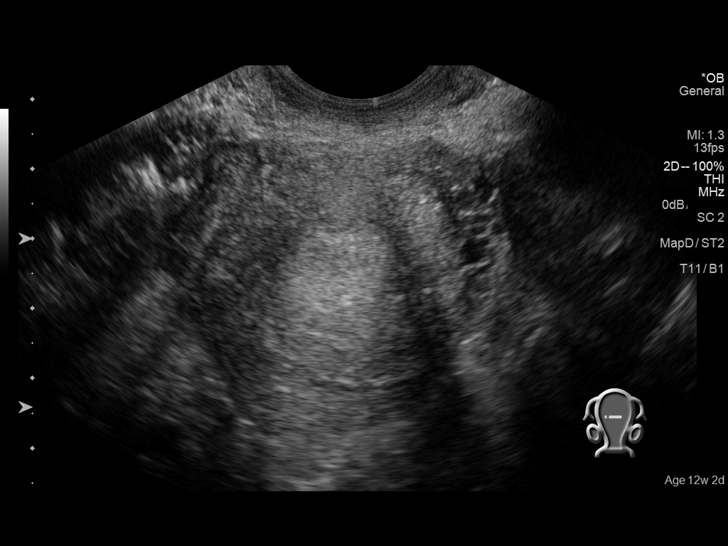
[im 57/97]
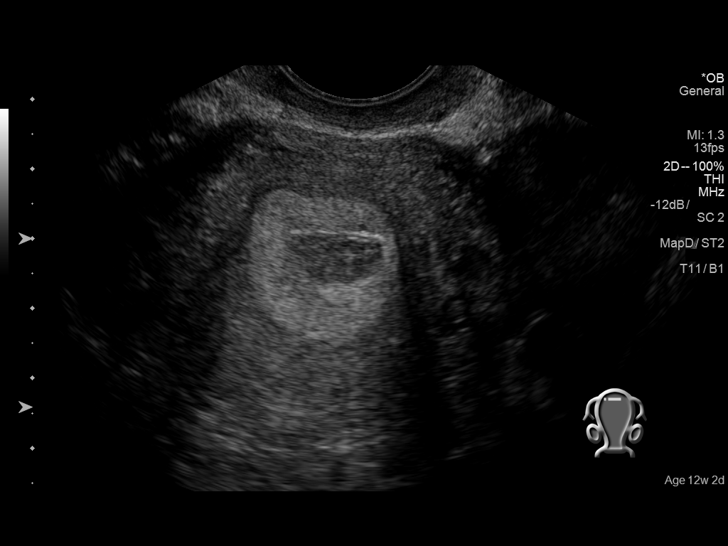
[im 65/97]
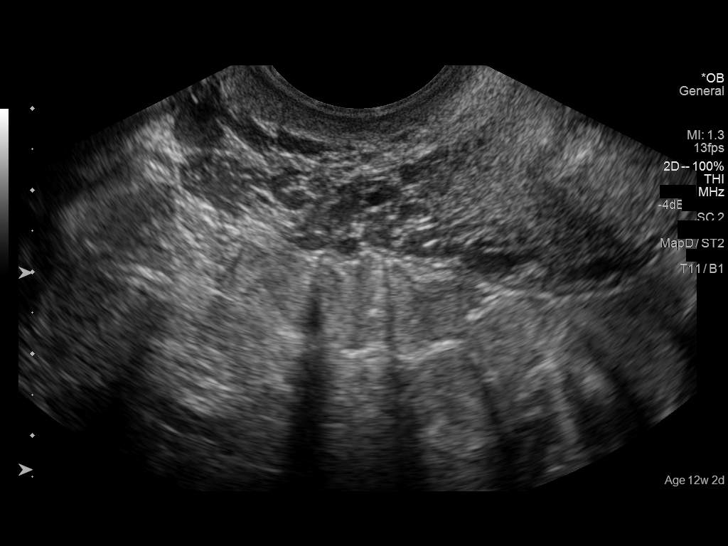
[im 72/97]
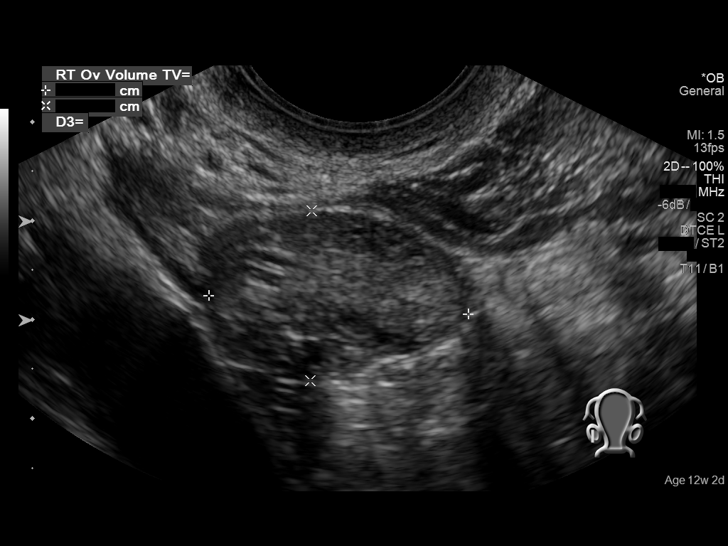
[im 79/97]
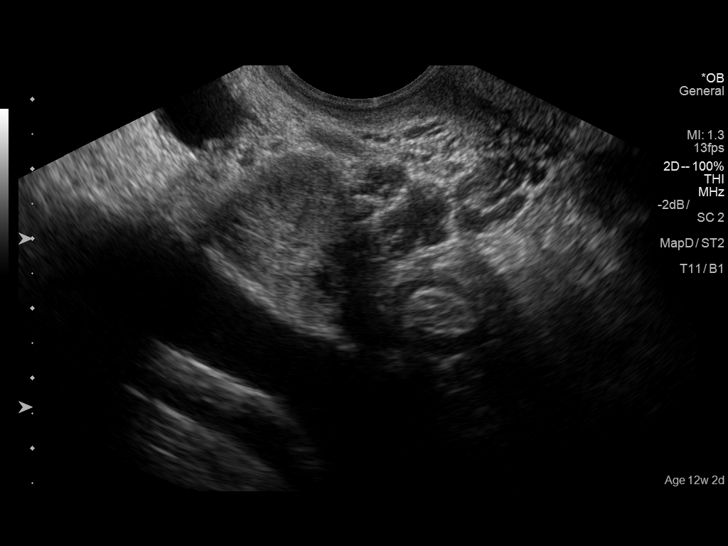
[im 86/97]
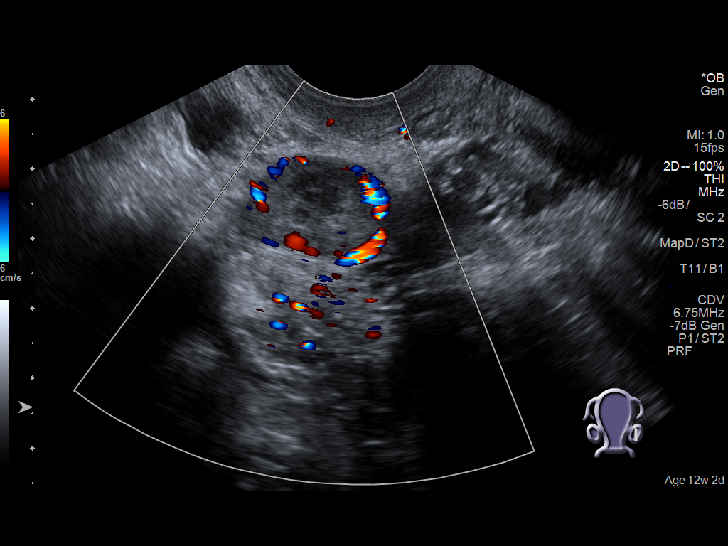
[im 93/97]
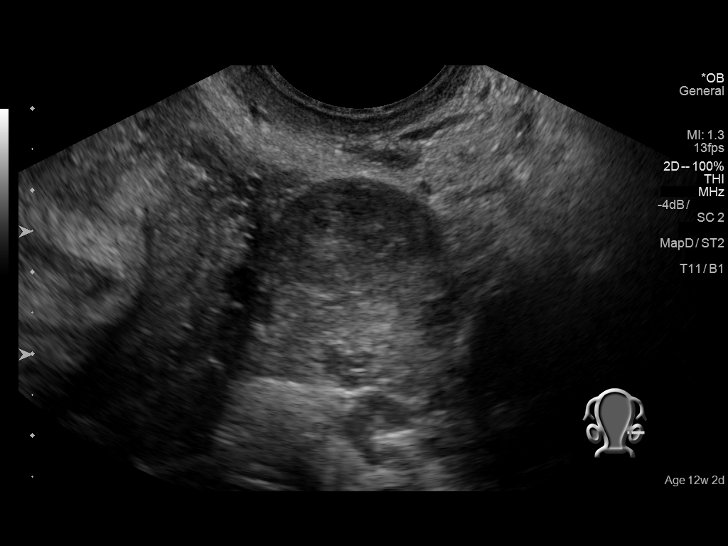

[13 of 28 positions shown; findings below may reference images not displayed]

FINDINGS: Intrauterine gestational sac: Present, with decidual reaction.

Yolk sac:  Not present

Embryo:  Not present

Cardiac Activity: Not present

MSD: 4  mm   5 w   1  d

Subchorionic hemorrhage:  None visualized.

Maternal uterus/adnexae: Moderate subchorionic hemorrhage.
Heterogeneous avascular 18 mm LEFT corpus luteal cyst. Normal
appearance the RIGHT ovary. Trace free fluid noted.
IMPRESSION: Gestational sac (discrepant dates given gestational age of 12 weeks
and 2 days) without yolk sac or embryo. Moderate subchorionic
hemorrhage. Recommend follow-up quantitative B-HCG levels and
follow-up US in 14 days to confirm and assess viability. This
recommendation follows SRU consensus guidelines: Diagnostic Criteria
for Nonviable Pregnancy Early in the First Trimester. N Engl J Med

## 2017-06-12 ENCOUNTER — Ambulatory Visit (INDEPENDENT_AMBULATORY_CARE_PROVIDER_SITE_OTHER): Payer: Medicaid Other | Admitting: Advanced Practice Midwife

## 2017-06-12 VITALS — BP 118/74 | Wt 160.0 lb

## 2017-06-12 DIAGNOSIS — Z3A29 29 weeks gestation of pregnancy: Secondary | ICD-10-CM

## 2017-06-12 NOTE — Progress Notes (Signed)
No Ctx's, LOF, VB. Continues on subutex. PNV with Fe.

## 2017-06-12 NOTE — Progress Notes (Signed)
No vb. No lof.  

## 2017-06-26 ENCOUNTER — Ambulatory Visit (INDEPENDENT_AMBULATORY_CARE_PROVIDER_SITE_OTHER): Payer: Medicaid Other | Admitting: Obstetrics and Gynecology

## 2017-06-26 VITALS — BP 116/84 | Wt 162.0 lb

## 2017-06-26 DIAGNOSIS — O0990 Supervision of high risk pregnancy, unspecified, unspecified trimester: Secondary | ICD-10-CM

## 2017-06-26 DIAGNOSIS — O219 Vomiting of pregnancy, unspecified: Secondary | ICD-10-CM

## 2017-06-26 DIAGNOSIS — O09299 Supervision of pregnancy with other poor reproductive or obstetric history, unspecified trimester: Secondary | ICD-10-CM

## 2017-06-26 DIAGNOSIS — O99320 Drug use complicating pregnancy, unspecified trimester: Secondary | ICD-10-CM

## 2017-06-26 DIAGNOSIS — Z3A31 31 weeks gestation of pregnancy: Secondary | ICD-10-CM

## 2017-06-26 MED ORDER — ONDANSETRON 8 MG PO TBDP: 8.0000 mg | ORAL_TABLET | Freq: Three times a day (TID) | ORAL | 0 refills | Status: DC | PRN

## 2017-06-26 NOTE — Progress Notes (Signed)
Prenatal Visit Note Date: 06/26/2017 [facility]  Subjective:  Isabella Owen is a 31 y.o. G3P0011 at 6746w4d being seen today for ongoing prenatal care.  She is currently monitored for the following issues for this high-risk pregnancy and has Supervision of high risk pregnancy, antepartum; Hx of preeclampsia, prior pregnancy, currently pregnant; and Substance abuse affecting pregnancy, antepartum on her problem list.   ----------------------------------------------------------------------------------- Patient reports nausea, no bleeding, no contractions, no leaking and vomiting.   Contractions: Not present. Vag. Bleeding: None.  Movement: Present. Denies leaking of fluid.  ----------------------------------------------------------------------------------- The following portions of the patient's history were reviewed and updated as appropriate: allergies, current medications, past family history, past medical history, past social history, past surgical history and problem list. Problem list updated.  Objective:   Vitals:   06/26/17 1036  BP: 116/84  Weight: 162 lb (73.5 kg)   Total weight gain for pregnancy: 15 lb (6.804 kg)   Fetal Status: Fetal Heart Rate (bpm): 140 Fundal Height: 31 cm Movement: Present     General:  Alert, oriented and cooperative. Patient is in no acute distress.  Skin: Skin is warm and dry. No rash noted.   Cardiovascular: Normal heart rate noted  Respiratory: Normal respiratory effort, no problems with respiration noted  Abdomen: Soft, gravid, appropriate for gestational age. Pain/Pressure: Absent     Pelvic:  Cervical exam deferred        Extremities: Normal range of motion.     Mental Status: Normal mood and affect. Normal behavior. Normal judgment and thought content.   Urinalysis: Urine Protein: Negative Urine Glucose: Negative  Assessment and Plan:  Pregnancy: G3P0011 at 1746w4d  1. Supervision of high risk pregnancy, antepartum -  ondansetron (ZOFRAN ODT) 8 MG disintegrating tablet; Take 1 tablet (8 mg total) by mouth every 8 (eight) hours as needed for nausea or vomiting.  Dispense: 20 tablet; Refill: 0 2. Substance abuse affecting pregnancy, antepartum -continue subutex 3. Hx of preeclampsia, prior pregnancy, currently pregnant 4. [redacted] weeks gestation of pregnancy 5. Nausea and vomiting during pregnancy - ondansetron (ZOFRAN ODT) 8 MG disintegrating tablet; Take 1 tablet (8 mg total) by mouth every 8 (eight) hours as needed for nausea or vomiting.  Dispense: 20 tablet; Refill: 0  Preterm labor symptoms and general obstetric precautions including but not limited to vaginal bleeding, contractions, leaking of fluid and fetal movement were reviewed in detail with the patient. Please refer to After Visit Summary for other counseling recommendations.   Return in about 2 weeks (around 07/10/2017) for Routine Prenatal Appointment.  Thomasene MohairStephen Lileigh Fahringer, MD 06/26/2017 11:07 AM

## 2017-07-13 ENCOUNTER — Encounter: Payer: Self-pay | Admitting: Obstetrics & Gynecology

## 2017-07-13 ENCOUNTER — Ambulatory Visit (INDEPENDENT_AMBULATORY_CARE_PROVIDER_SITE_OTHER): Payer: Medicaid Other | Admitting: Obstetrics & Gynecology

## 2017-07-13 VITALS — BP 110/70 | Wt 164.0 lb

## 2017-07-13 DIAGNOSIS — O09299 Supervision of pregnancy with other poor reproductive or obstetric history, unspecified trimester: Secondary | ICD-10-CM

## 2017-07-13 DIAGNOSIS — O99320 Drug use complicating pregnancy, unspecified trimester: Secondary | ICD-10-CM

## 2017-07-13 DIAGNOSIS — O0990 Supervision of high risk pregnancy, unspecified, unspecified trimester: Secondary | ICD-10-CM

## 2017-07-13 DIAGNOSIS — Z3A34 34 weeks gestation of pregnancy: Secondary | ICD-10-CM

## 2017-07-13 NOTE — Patient Instructions (Signed)
Third Trimester of Pregnancy The third trimester is from week 28 through week 40 (months 7 through 9). The third trimester is a time when the unborn baby (fetus) is growing rapidly. At the end of the ninth month, the fetus is about 20 inches in length and weighs 6-10 pounds. Body changes during your third trimester Your body will continue to go through many changes during pregnancy. The changes vary from woman to woman. During the third trimester:  Your weight will continue to increase. You can expect to gain 25-35 pounds (11-16 kg) by the end of the pregnancy.  You may begin to get stretch marks on your hips, abdomen, and breasts.  You may urinate more often because the fetus is moving lower into your pelvis and pressing on your bladder.  You may develop or continue to have heartburn. This is caused by increased hormones that slow down muscles in the digestive tract.  You may develop or continue to have constipation because increased hormones slow digestion and cause the muscles that push waste through your intestines to relax.  You may develop hemorrhoids. These are swollen veins (varicose veins) in the rectum that can itch or be painful.  You may develop swollen, bulging veins (varicose veins) in your legs.  You may have increased body aches in the pelvis, back, or thighs. This is due to weight gain and increased hormones that are relaxing your joints.  You may have changes in your hair. These can include thickening of your hair, rapid growth, and changes in texture. Some women also have hair loss during or after pregnancy, or hair that feels dry or thin. Your hair will most likely return to normal after your baby is born.  Your breasts will continue to grow and they will continue to become tender. A yellow fluid (colostrum) may leak from your breasts. This is the first milk you are producing for your baby.  Your belly button may stick out.  You may notice more swelling in your hands,  face, or ankles.  You may have increased tingling or numbness in your hands, arms, and legs. The skin on your belly may also feel numb.  You may feel short of breath because of your expanding uterus.  You may have more problems sleeping. This can be caused by the size of your belly, increased need to urinate, and an increase in your body's metabolism.  You may notice the fetus "dropping," or moving lower in your abdomen (lightening).  You may have increased vaginal discharge.  You may notice your joints feel loose and you may have pain around your pelvic bone.  What to expect at prenatal visits You will have prenatal exams every 2 weeks until week 36. Then you will have weekly prenatal exams. During a routine prenatal visit:  You will be weighed to make sure you and the baby are growing normally.  Your blood pressure will be taken.  Your abdomen will be measured to track your baby's growth.  The fetal heartbeat will be listened to.  Any test results from the previous visit will be discussed.  You may have a cervical check near your due date to see if your cervix has softened or thinned (effaced).  You will be tested for Group B streptococcus. This happens between 35 and 37 weeks.  Your health care provider may ask you:  What your birth plan is.  How you are feeling.  If you are feeling the baby move.  If you have had   any abnormal symptoms, such as leaking fluid, bleeding, severe headaches, or abdominal cramping.  If you are using any tobacco products, including cigarettes, chewing tobacco, and electronic cigarettes.  If you have any questions.  Other tests or screenings that may be performed during your third trimester include:  Blood tests that check for low iron levels (anemia).  Fetal testing to check the health, activity level, and growth of the fetus. Testing is done if you have certain medical conditions or if there are problems during the  pregnancy.  Nonstress test (NST). This test checks the health of your baby to make sure there are no signs of problems, such as the baby not getting enough oxygen. During this test, a belt is placed around your belly. The baby is made to move, and its heart rate is monitored during movement.  What is false labor? False labor is a condition in which you feel small, irregular tightenings of the muscles in the womb (contractions) that usually go away with rest, changing position, or drinking water. These are called Braxton Hicks contractions. Contractions may last for hours, days, or even weeks before true labor sets in. If contractions come at regular intervals, become more frequent, increase in intensity, or become painful, you should see your health care provider. What are the signs of labor?  Abdominal cramps.  Regular contractions that start at 10 minutes apart and become stronger and more frequent with time.  Contractions that start on the top of the uterus and spread down to the lower abdomen and back.  Increased pelvic pressure and dull back pain.  A watery or bloody mucus discharge that comes from the vagina.  Leaking of amniotic fluid. This is also known as your "water breaking." It could be a slow trickle or a gush. Let your health care provider know if it has a color or strange odor. If you have any of these signs, call your health care provider right away, even if it is before your due date. Follow these instructions at home: Medicines  Follow your health care provider's instructions regarding medicine use. Specific medicines may be either safe or unsafe to take during pregnancy.  Take a prenatal vitamin that contains at least 600 micrograms (mcg) of folic acid.  If you develop constipation, try taking a stool softener if your health care provider approves. Eating and drinking  Eat a balanced diet that includes fresh fruits and vegetables, whole grains, good sources of protein  such as meat, eggs, or tofu, and low-fat dairy. Your health care provider will help you determine the amount of weight gain that is right for you.  Avoid raw meat and uncooked cheese. These carry germs that can cause birth defects in the baby.  If you have low calcium intake from food, talk to your health care provider about whether you should take a daily calcium supplement.  Eat four or five small meals rather than three large meals a day.  Limit foods that are high in fat and processed sugars, such as fried and sweet foods.  To prevent constipation: ? Drink enough fluid to keep your urine clear or pale yellow. ? Eat foods that are high in fiber, such as fresh fruits and vegetables, whole grains, and beans. Activity  Exercise only as directed by your health care provider. Most women can continue their usual exercise routine during pregnancy. Try to exercise for 30 minutes at least 5 days a week. Stop exercising if you experience uterine contractions.  Avoid heavy   lifting.  Do not exercise in extreme heat or humidity, or at high altitudes.  Wear low-heel, comfortable shoes.  Practice good posture.  You may continue to have sex unless your health care provider tells you otherwise. Relieving pain and discomfort  Take frequent breaks and rest with your legs elevated if you have leg cramps or low back pain.  Take warm sitz baths to soothe any pain or discomfort caused by hemorrhoids. Use hemorrhoid cream if your health care provider approves.  Wear a good support bra to prevent discomfort from breast tenderness.  If you develop varicose veins: ? Wear support pantyhose or compression stockings as told by your healthcare provider. ? Elevate your feet for 15 minutes, 3-4 times a day. Prenatal care  Write down your questions. Take them to your prenatal visits.  Keep all your prenatal visits as told by your health care provider. This is important. Safety  Wear your seat belt at  all times when driving.  Make a list of emergency phone numbers, including numbers for family, friends, the hospital, and police and fire departments. General instructions  Avoid cat litter boxes and soil used by cats. These carry germs that can cause birth defects in the baby. If you have a cat, ask someone to clean the litter box for you.  Do not travel far distances unless it is absolutely necessary and only with the approval of your health care provider.  Do not use hot tubs, steam rooms, or saunas.  Do not drink alcohol.  Do not use any products that contain nicotine or tobacco, such as cigarettes and e-cigarettes. If you need help quitting, ask your health care provider.  Do not use any medicinal herbs or unprescribed drugs. These chemicals affect the formation and growth of the baby.  Do not douche or use tampons or scented sanitary pads.  Do not cross your legs for long periods of time.  To prepare for the arrival of your baby: ? Take prenatal classes to understand, practice, and ask questions about labor and delivery. ? Make a trial run to the hospital. ? Visit the hospital and tour the maternity area. ? Arrange for maternity or paternity leave through employers. ? Arrange for family and friends to take care of pets while you are in the hospital. ? Purchase a rear-facing car seat and make sure you know how to install it in your car. ? Pack your hospital bag. ? Prepare the baby's nursery. Make sure to remove all pillows and stuffed animals from the baby's crib to prevent suffocation.  Visit your dentist if you have not gone during your pregnancy. Use a soft toothbrush to brush your teeth and be gentle when you floss. Contact a health care provider if:  You are unsure if you are in labor or if your water has broken.  You become dizzy.  You have mild pelvic cramps, pelvic pressure, or nagging pain in your abdominal area.  You have lower back pain.  You have persistent  nausea, vomiting, or diarrhea.  You have an unusual or bad smelling vaginal discharge.  You have pain when you urinate. Get help right away if:  Your water breaks before 37 weeks.  You have regular contractions less than 5 minutes apart before 37 weeks.  You have a fever.  You are leaking fluid from your vagina.  You have spotting or bleeding from your vagina.  You have severe abdominal pain or cramping.  You have rapid weight loss or weight gain.    You have shortness of breath with chest pain.  You notice sudden or extreme swelling of your face, hands, ankles, feet, or legs.  Your baby makes fewer than 10 movements in 2 hours.  You have severe headaches that do not go away when you take medicine.  You have vision changes. Summary  The third trimester is from week 28 through week 40, months 7 through 9. The third trimester is a time when the unborn baby (fetus) is growing rapidly.  During the third trimester, your discomfort may increase as you and your baby continue to gain weight. You may have abdominal, leg, and back pain, sleeping problems, and an increased need to urinate.  During the third trimester your breasts will keep growing and they will continue to become tender. A yellow fluid (colostrum) may leak from your breasts. This is the first milk you are producing for your baby.  False labor is a condition in which you feel small, irregular tightenings of the muscles in the womb (contractions) that eventually go away. These are called Braxton Hicks contractions. Contractions may last for hours, days, or even weeks before true labor sets in.  Signs of labor can include: abdominal cramps; regular contractions that start at 10 minutes apart and become stronger and more frequent with time; watery or bloody mucus discharge that comes from the vagina; increased pelvic pressure and dull back pain; and leaking of amniotic fluid. This information is not intended to replace advice  given to you by your health care provider. Make sure you discuss any questions you have with your health care provider. Document Released: 11/04/2001 Document Revised: 04/17/2016 Document Reviewed: 01/11/2013 Elsevier Interactive Patient Education  2017 [employer]  

## 2017-07-13 NOTE — Progress Notes (Signed)
Prenatal Visit Note Date: 07/13/2017 Clinic: [facility]  Subjective:  Isabella Owen is a 31 y.o. G3P1011 at [redacted]w[redacted]d being seen today for ongoing prenatal care.  She is currently monitored for the following issues for this high-risk pregnancy and has Supervision of high risk pregnancy, antepartum; Hx of preeclampsia, prior pregnancy, currently pregnant; and Substance abuse affecting pregnancy, antepartum on her problem list.  Patient reports no complaints.   Contractions: Not present. Vag. Bleeding: None.  Movement: Present. Denies leaking of fluid.   The following portions of the patient's history were reviewed and updated as appropriate: allergies, current medications, past family history, past medical history, past social history, past surgical history and problem list. Problem list updated.  Objective:   Vitals:   07/13/17 1426  BP: 110/70  Weight: 164 lb (74.4 kg)    Fetal Status:     Movement: Present     General:  Alert, oriented and cooperative. Patient is in no acute distress.  Skin: Skin is warm and dry. No rash noted.   Abdomen: Soft, gravid, appropriate for gestational age. Pain/Pressure: Absent     Pelvic:  Cervical exam deferred        Extremities: Normal range of motion.     Mental Status: Normal mood and affect. Normal behavior. Normal judgment and thought content.   Urinalysis: Urine Protein: Negative Urine Glucose: Negative  Assessment and Plan:  Pregnancy: G3P1011 at [redacted]w[redacted]d  1. [redacted] weeks gestation of pregnancy  2. Supervision of high risk pregnancy, antepartum  3. Substance abuse affecting pregnancy, antepartum Subutex  4. Hx of preeclampsia, prior pregnancy, currently pregnant Normal BP now  Preterm labor symptoms and general obstetric precautions including but not limited to vaginal bleeding, contractions, leaking of fluid and fetal movement were reviewed in detail with the patient. Please refer to After Visit Summary for other counseling  recommendations.  Return in about 2 weeks (around 07/27/2017) for ROB. GBS nv  Annamarie Major, MD, [facility], [facility] 07/13/2017  2:50 PM

## 2017-07-14 ENCOUNTER — Other Ambulatory Visit: Payer: Self-pay | Admitting: Maternal Newborn

## 2017-07-14 ENCOUNTER — Telehealth: Payer: Self-pay

## 2017-07-14 ENCOUNTER — Other Ambulatory Visit: Payer: Self-pay

## 2017-07-14 DIAGNOSIS — O219 Vomiting of pregnancy, unspecified: Secondary | ICD-10-CM

## 2017-07-14 DIAGNOSIS — O0990 Supervision of high risk pregnancy, unspecified, unspecified trimester: Secondary | ICD-10-CM

## 2017-07-14 MED ORDER — ONDANSETRON 8 MG PO TBDP: 8.0000 mg | ORAL_TABLET | Freq: Three times a day (TID) | ORAL | 0 refills | Status: DC | PRN

## 2017-07-14 NOTE — Progress Notes (Unsigned)
Pt called for Zofran refill. Renewing Rx for 20 tabs.

## 2017-07-14 NOTE — Telephone Encounter (Signed)
Pt called stating she saw [facility] yesterday and he was going to refill her Zofran. It is not at the pharmacy. She states she really needs this. CB# [phone]  Shanda Bumps, see if [facility] will refill this.

## 2017-07-14 NOTE — Telephone Encounter (Signed)
Sent refill to [facility] for Zofran. Thanks!

## 2017-07-29 ENCOUNTER — Ambulatory Visit (INDEPENDENT_AMBULATORY_CARE_PROVIDER_SITE_OTHER): Payer: Medicaid Other | Admitting: Obstetrics & Gynecology

## 2017-07-29 ENCOUNTER — Other Ambulatory Visit: Payer: Self-pay

## 2017-07-29 VITALS — BP 110/70 | Wt 170.0 lb

## 2017-07-29 DIAGNOSIS — O219 Vomiting of pregnancy, unspecified: Secondary | ICD-10-CM

## 2017-07-29 DIAGNOSIS — O0990 Supervision of high risk pregnancy, unspecified, unspecified trimester: Secondary | ICD-10-CM

## 2017-07-29 DIAGNOSIS — O09299 Supervision of pregnancy with other poor reproductive or obstetric history, unspecified trimester: Secondary | ICD-10-CM

## 2017-07-29 DIAGNOSIS — Z3A36 36 weeks gestation of pregnancy: Secondary | ICD-10-CM

## 2017-07-29 DIAGNOSIS — O99320 Drug use complicating pregnancy, unspecified trimester: Secondary | ICD-10-CM

## 2017-07-29 MED ORDER — ONDANSETRON 8 MG PO TBDP: 8.0000 mg | ORAL_TABLET | Freq: Three times a day (TID) | ORAL | 0 refills | Status: DC | PRN

## 2017-07-29 NOTE — Progress Notes (Signed)
PNV, FMC, Labor precautions, Breast feeding plans Cont Subutex

## 2017-07-29 NOTE — Patient Instructions (Signed)

## 2017-08-02 LAB — CULTURE, BETA STREP (GROUP B ONLY): Strep Gp B Culture: NEGATIVE

## 2017-08-05 ENCOUNTER — Ambulatory Visit (INDEPENDENT_AMBULATORY_CARE_PROVIDER_SITE_OTHER): Payer: Medicaid Other | Admitting: Advanced Practice Midwife

## 2017-08-05 VITALS — BP 110/80 | Wt 171.0 lb

## 2017-08-05 DIAGNOSIS — O219 Vomiting of pregnancy, unspecified: Secondary | ICD-10-CM

## 2017-08-05 DIAGNOSIS — Z3A37 37 weeks gestation of pregnancy: Secondary | ICD-10-CM

## 2017-08-05 MED ORDER — ONDANSETRON 8 MG PO TBDP: 8.0000 mg | ORAL_TABLET | Freq: Three times a day (TID) | ORAL | 0 refills | Status: DC | PRN

## 2017-08-05 NOTE — Progress Notes (Signed)
Good fetal movement. Denies Ctx's, LOF, VB. Reviewed labor precautions. Cont. Subutex.

## 2017-08-05 NOTE — Patient Instructions (Signed)
Vaginal Delivery Vaginal delivery means that you will give birth by pushing your baby out of your birth canal (vagina). A team of health care providers will help you before, during, and after vaginal delivery. Birth experiences are unique for every woman and every pregnancy, and birth experiences vary depending on where you choose to give birth. What should I do to prepare for my baby's birth? Before your baby is born, it is important to talk with your health care provider about:  Your labor and delivery preferences. These may include: ? Medicines that you may be given. ? How you will manage your pain. This might include non-medical pain relief techniques or injectable pain relief such as epidural analgesia. ? How you and your baby will be monitored during labor and delivery. ? Who may be in the labor and delivery room with you. ? Your feelings about surgical delivery of your baby (cesarean delivery, or C-section) if this becomes necessary. ? Your feelings about receiving donated blood through an IV tube (blood transfusion) if this becomes necessary.  Whether you are able: ? To take pictures or videos of the birth. ? To eat during labor and delivery. ? To move around, walk, or change positions during labor and delivery.  What to expect after your baby is born, such as: ? Whether delayed umbilical cord clamping and cutting is offered. ? Who will care for your baby right after birth. ? Medicines or tests that may be recommended for your baby. ? Whether breastfeeding is supported in your hospital or birth center. ? How long you will be in the hospital or birth center.  How any medical conditions you have may affect your baby or your labor and delivery experience.  To prepare for your baby's birth, you should also:  Attend all of your health care visits before delivery (prenatal visits) as recommended by your health care provider. This is important.  Prepare your home for your baby's  arrival. Make sure that you have: ? Diapers. ? Baby clothing. ? Feeding equipment. ? Safe sleeping arrangements for you and your baby.  Install a car seat in your vehicle. Have your car seat checked by a certified car seat installer to make sure that it is installed safely.  Think about who will help you with your new baby at home for at least the first several weeks after delivery.  What can I expect when I arrive at the birth center or hospital? Once you are in labor and have been admitted into the hospital or birth center, your health care provider may:  Review your pregnancy history and any concerns you have.  Insert an IV tube into one of your veins. This is used to give you fluids and medicines.  Check your blood pressure, pulse, temperature, and heart rate (vital signs).  Check whether your bag of water (amniotic sac) has broken (ruptured).  Talk with you about your birth plan and discuss pain control options.  Monitoring Your health care provider may monitor your contractions (uterine monitoring) and your baby's heart rate (fetal monitoring). You may need to be monitored:  Often, but not continuously (intermittently).  All the time or for long periods at a time (continuously). Continuous monitoring may be needed if: ? You are taking certain medicines, such as medicine to relieve pain or make your contractions stronger. ? You have pregnancy or labor complications.  Monitoring may be done by:  Placing a special stethoscope or a handheld monitoring device on your abdomen to   check your baby's heartbeat, and feeling your abdomen for contractions. This method of monitoring does not continuously record your baby's heartbeat or your contractions.  Placing monitors on your abdomen (external monitors) to record your baby's heartbeat and the frequency and length of contractions. You may not have to wear external monitors all the time.  Placing monitors inside of your uterus  (internal monitors) to record your baby's heartbeat and the frequency, length, and strength of your contractions. ? Your health care provider may use internal monitors if he or she needs more information about the strength of your contractions or your baby's heart rate. ? Internal monitors are put in place by passing a thin, flexible wire through your vagina and into your uterus. Depending on the type of monitor, it may remain in your uterus or on your baby's head until birth. ? Your health care provider will discuss the benefits and risks of internal monitoring with you and will ask for your permission before inserting the monitors.  Telemetry. This is a type of continuous monitoring that can be done with external or internal monitors. Instead of having to stay in bed, you are able to move around during telemetry. Ask your health care provider if telemetry is an option for you.  Physical exam Your health care provider may perform a physical exam. This may include:  Checking whether your baby is positioned: ? With the head toward your vagina (head-down). This is most common. ? With the head toward the top of your uterus (head-up or breech). If your baby is in a breech position, your health care provider may try to turn your baby to a head-down position so you can deliver vaginally. If it does not seem that your baby can be born vaginally, your provider may recommend surgery to deliver your baby. In rare cases, you may be able to deliver vaginally if your baby is head-up (breech delivery). ? Lying sideways (transverse). Babies that are lying sideways cannot be delivered vaginally.  Checking your cervix to determine: ? Whether it is thinning out (effacing). ? Whether it is opening up (dilating). ? How low your baby has moved into your birth canal.  What are the three stages of labor and delivery?  Normal labor and delivery is divided into the following three stages: Stage 1  Stage 1 is the  longest stage of labor, and it can last for hours or days. Stage 1 includes: ? Early labor. This is when contractions may be irregular, or regular and mild. Generally, early labor contractions are more than 10 minutes apart. ? Active labor. This is when contractions get longer, more regular, more frequent, and more intense. ? The transition phase. This is when contractions happen very close together, are very intense, and may last longer than during any other part of labor.  Contractions generally feel mild, infrequent, and irregular at first. They get stronger, more frequent (about every 2-3 minutes), and more regular as you progress from early labor through active labor and transition.  Many women progress through stage 1 naturally, but you may need help to continue making progress. If this happens, your health care provider may talk with you about: ? Rupturing your amniotic sac if it has not ruptured yet. ? Giving you medicine to help make your contractions stronger and more frequent.  Stage 1 ends when your cervix is completely dilated to 4 inches (10 cm) and completely effaced. This happens at the end of the transition phase. Stage 2  Once   your cervix is completely effaced and dilated to 4 inches (10 cm), you may start to feel an urge to push. It is common for the body to naturally take a rest before feeling the urge to push, especially if you received an epidural or certain other pain medicines. This rest period may last for up to 1-2 hours, depending on your unique labor experience.  During stage 2, contractions are generally less painful, because pushing helps relieve contraction pain. Instead of contraction pain, you may feel stretching and burning pain, especially when the widest part of your baby's head passes through the vaginal opening (crowning).  Your health care provider will closely monitor your pushing progress and your baby's progress through the vagina during stage 2.  Your  health care provider may massage the area of skin between your vaginal opening and anus (perineum) or apply warm compresses to your perineum. This helps it stretch as the baby's head starts to crown, which can help prevent perineal tearing. ? In some cases, an incision may be made in your perineum (episiotomy) to allow the baby to pass through the vaginal opening. An episiotomy helps to make the opening of the vagina larger to allow more room for the baby to fit through.  It is very important to breathe and focus so your health care provider can control the delivery of your baby's head. Your health care provider may have you decrease the intensity of your pushing, to help prevent perineal tearing.  After delivery of your baby's head, the shoulders and the rest of the body generally deliver very quickly and without difficulty.  Once your baby is delivered, the umbilical cord may be cut right away, or this may be delayed for 1-2 minutes, depending on your baby's health. This may vary among health care providers, hospitals, and birth centers.  If you and your baby are healthy enough, your baby may be placed on your chest or abdomen to help maintain the baby's temperature and to help you bond with each other. Some mothers and babies start breastfeeding at this time. Your health care team will dry your baby and help keep your baby warm during this time.  Your baby may need immediate care if he or she: ? Showed signs of distress during labor. ? Has a medical condition. ? Was born too early (prematurely). ? Had a bowel movement before birth (meconium). ? Shows signs of difficulty transitioning from being inside the uterus to being outside of the uterus. If you are planning to breastfeed, your health care team will help you begin a feeding. Stage 3  The third stage of labor starts immediately after the birth of your baby and ends after you deliver the placenta. The placenta is an organ that develops  during pregnancy to provide oxygen and nutrients to your baby in the womb.  Delivering the placenta may require some pushing, and you may have mild contractions. Breastfeeding can stimulate contractions to help you deliver the placenta.  After the placenta is delivered, your uterus should tighten (contract) and become firm. This helps to stop bleeding in your uterus. To help your uterus contract and to control bleeding, your health care provider may: ? Give you medicine by injection, through an IV tube, by mouth, or through your rectum (rectally). ? Massage your abdomen or perform a vaginal exam to remove any blood clots that are left in your uterus. ? Empty your bladder by placing a thin, flexible tube (catheter) into your bladder. ? Encourage   you to breastfeed your baby. After labor is over, you and your baby will be monitored closely to ensure that you are both healthy until you are ready to go home. Your health care team will teach you how to care for yourself and your baby. This information is not intended to replace advice given to you by your health care provider. Make sure you discuss any questions you have with your health care provider. Document Released: 08/19/2008 Document Revised: 05/30/2016 Document Reviewed: 11/25/2015 Elsevier Interactive Patient Education  2018 [employer]  

## 2017-08-06 ENCOUNTER — Encounter: Payer: Medicaid Other | Admitting: Maternal Newborn

## 2017-08-07 ENCOUNTER — Encounter: Payer: Medicaid Other | Admitting: Maternal Newborn

## 2017-08-12 ENCOUNTER — Ambulatory Visit (INDEPENDENT_AMBULATORY_CARE_PROVIDER_SITE_OTHER): Payer: Medicaid Other | Admitting: Maternal Newborn

## 2017-08-12 VITALS — BP 114/70 | Wt 171.0 lb

## 2017-08-12 DIAGNOSIS — O0990 Supervision of high risk pregnancy, unspecified, unspecified trimester: Secondary | ICD-10-CM

## 2017-08-12 DIAGNOSIS — O99320 Drug use complicating pregnancy, unspecified trimester: Secondary | ICD-10-CM

## 2017-08-12 DIAGNOSIS — O09299 Supervision of pregnancy with other poor reproductive or obstetric history, unspecified trimester: Secondary | ICD-10-CM

## 2017-08-12 DIAGNOSIS — Z3A38 38 weeks gestation of pregnancy: Secondary | ICD-10-CM

## 2017-08-12 NOTE — Progress Notes (Signed)
    Routine Prenatal Care Visit  Subjective  [name] is a 31 y.o. G3P1011 at [redacted]w[redacted]d being seen today for ongoing prenatal care.  She is currently monitored for the following issues for this high-risk pregnancy and has Supervision of high risk pregnancy, antepartum; Hx of preeclampsia, prior pregnancy, currently pregnant; and Substance abuse affecting pregnancy, antepartum on her problem list.  ----------------------------------------------------------------------------------- Patient reports pelvic pressure and frequent Braxton-Hicks contractions.   Contractions: Not present. Vag. Bleeding: None.  Movement: Present. Denies leaking of fluid.  ----------------------------------------------------------------------------------- The following portions of the patient's history were reviewed and updated as appropriate: allergies, current medications, past family history, past medical history, past social history, past surgical history and problem list. Problem list updated.  Objective  Blood pressure 114/70, weight 171 lb (77.6 kg), last menstrual period 11/17/2016. Pregravid weight 147 lb (66.7 kg) Total Weight Gain 24 lb (10.9 kg) Urinalysis: Urine Protein: Trace Urine Glucose: Negative  Fetal Status: Fetal Heart Rate (bpm): 120 Fundal Height: 34 cm Movement: Present  Presentation: Vertex  General:  Alert, oriented and cooperative. Patient is in no acute distress.  Skin: Skin is warm and dry. No rash noted.   Cardiovascular: Normal heart rate noted  Respiratory: Normal respiratory effort, no problems with respiration noted  Abdomen: Soft, gravid, appropriate for gestational age. Pain/Pressure: Present     Pelvic:  Cervical exam performed Dilation: 1 Effacement (%): 50 Station: -2  Extremities: Normal range of motion.     Mental Status: Normal mood and affect. Normal behavior. Normal judgment and thought content.     Assessment   31 y.o. G3P1011 at [redacted]w[redacted]d by  08/24/2017, by Last  Menstrual Period presenting for routine prenatal visit.  Plan   pregnancy 3 Problems (from 11/17/16 to present)    Problem Noted Resolved   Supervision of high risk pregnancy, antepartum 04/01/2017 by Vena Austria, MD No   Overview Addendum 04/01/2017 10:51 AM by Vena Austria, MD    Clinic [facility] Prenatal Labs  Dating 16 week scan Blood type: A/Positive/-- (04/11 1429)   Genetic Screen Declined Antibody:Negative (04/11 1429)  Anatomic [place] Incomplete face 5/9 Rubella: 2.87 (04/11 1429) Varicella: I  GTT Early:   N/A            Third trimester:  RPR: Non Reactive (04/11 1429)   Rhogam  HBsAg: Negative (04/11 1429)   TDaP vaccine                       Flu Shot: HIV: Non Reactive (04/11 1429)   Baby Food                                GBS:   Contraception  Pap:  CBB     CS/VBAC    Support Person               Substance abuse affecting pregnancy, antepartum 04/01/2017 by Vena Austria, MD No   Overview Signed 04/01/2017 10:40 AM by Vena Austria, MD    Subutex           Term labor symptoms and general obstetric precautions including but not limited to vaginal bleeding, contractions, leaking of fluid and fetal movement were reviewed in detail with the patient.  Return in about 1 week (around 08/19/2017) for ROB.  Marcelyn Bruins, CNM 08/12/2017  11:31 AM

## 2017-08-15 ENCOUNTER — Inpatient Hospital Stay
Admission: EM | Admit: 2017-08-15 | Discharge: 2017-08-17 | DRG: 775 | Disposition: A | Discharge status: Home / Self Care | Payer: Medicaid Other | Attending: Obstetrics and Gynecology | Admitting: Obstetrics and Gynecology

## 2017-08-15 ENCOUNTER — Inpatient Hospital Stay: Payer: Medicaid Other | Admitting: Anesthesiology

## 2017-08-15 DIAGNOSIS — O99324 Drug use complicating childbirth: Principal | ICD-10-CM | POA: Diagnosis present

## 2017-08-15 DIAGNOSIS — R030 Elevated blood-pressure reading, without diagnosis of hypertension: Secondary | ICD-10-CM | POA: Diagnosis present

## 2017-08-15 DIAGNOSIS — Z87891 Personal history of nicotine dependence: Secondary | ICD-10-CM | POA: Diagnosis not present

## 2017-08-15 DIAGNOSIS — O99320 Drug use complicating pregnancy, unspecified trimester: Secondary | ICD-10-CM

## 2017-08-15 DIAGNOSIS — O9081 Anemia of the puerperium: Secondary | ICD-10-CM | POA: Diagnosis not present

## 2017-08-15 DIAGNOSIS — F1290 Cannabis use, unspecified, uncomplicated: Secondary | ICD-10-CM | POA: Diagnosis present

## 2017-08-15 DIAGNOSIS — D62 Acute posthemorrhagic anemia: Secondary | ICD-10-CM | POA: Diagnosis not present

## 2017-08-15 DIAGNOSIS — O163 Unspecified maternal hypertension, third trimester: Secondary | ICD-10-CM | POA: Diagnosis present

## 2017-08-15 DIAGNOSIS — O26893 Other specified pregnancy related conditions, third trimester: Secondary | ICD-10-CM | POA: Diagnosis present

## 2017-08-15 DIAGNOSIS — Z3A38 38 weeks gestation of pregnancy: Secondary | ICD-10-CM

## 2017-08-15 DIAGNOSIS — O0990 Supervision of high risk pregnancy, unspecified, unspecified trimester: Secondary | ICD-10-CM

## 2017-08-15 DIAGNOSIS — F1911 Other psychoactive substance abuse, in remission: Secondary | ICD-10-CM

## 2017-08-15 DIAGNOSIS — O09299 Supervision of pregnancy with other poor reproductive or obstetric history, unspecified trimester: Secondary | ICD-10-CM

## 2017-08-15 HISTORY — DX: Other psychoactive substance abuse, uncomplicated: F19.10

## 2017-08-15 HISTORY — DX: Cardiac arrhythmia, unspecified: I49.9

## 2017-08-15 LAB — COMPREHENSIVE METABOLIC PANEL
ALT: 14 U/L (ref 14–54)
ANION GAP: 9 (ref 5–15)
AST: 22 U/L (ref 15–41)
Albumin: 3.1 g/dL — ABNORMAL LOW (ref 3.5–5.0)
Alkaline Phosphatase: 892 U/L — ABNORMAL HIGH (ref 38–126)
BUN: <5 mg/dL — ABNORMAL LOW (ref 6–20)
CALCIUM: 8.7 mg/dL — AB (ref 8.9–10.3)
CHLORIDE: 105 mmol/L (ref 101–111)
CO2: 22 mmol/L (ref 22–32)
CREATININE: 0.45 mg/dL (ref 0.44–1.00)
Glucose, Bld: 92 mg/dL (ref 65–99)
Potassium: 3 mmol/L — ABNORMAL LOW (ref 3.5–5.1)
Sodium: 136 mmol/L (ref 135–145)
Total Bilirubin: 0.5 mg/dL (ref 0.3–1.2)
Total Protein: 7.2 g/dL (ref 6.5–8.1)

## 2017-08-15 LAB — URINE DRUG SCREEN, QUALITATIVE (ARMC ONLY)
AMPHETAMINES, UR SCREEN: NOT DETECTED
BENZODIAZEPINE, UR SCRN: NOT DETECTED
Barbiturates, Ur Screen: NOT DETECTED
CANNABINOID 50 NG, UR ~~LOC~~: POSITIVE — AB
Cocaine Metabolite,Ur ~~LOC~~: NOT DETECTED
MDMA (Ecstasy)Ur Screen: NOT DETECTED
Methadone Scn, Ur: NOT DETECTED
OPIATE, UR SCREEN: NOT DETECTED
PHENCYCLIDINE (PCP) UR S: NOT DETECTED
Tricyclic, Ur Screen: NOT DETECTED

## 2017-08-15 LAB — CBC
HEMATOCRIT: 36.1 % (ref 35.0–47.0)
HEMOGLOBIN: 12.4 g/dL (ref 12.0–16.0)
MCH: 28.7 pg (ref 26.0–34.0)
MCHC: 34.2 g/dL (ref 32.0–36.0)
MCV: 83.8 fL (ref 80.0–100.0)
Platelets: 362 10*3/uL (ref 150–440)
RBC: 4.31 MIL/uL (ref 3.80–5.20)
RDW: 13 % (ref 11.5–14.5)
WBC: 18.7 10*3/uL — AB (ref 3.6–11.0)

## 2017-08-15 LAB — PROTEIN / CREATININE RATIO, URINE
CREATININE, URINE: 180 mg/dL
Protein Creatinine Ratio: 0.23 mg/mg{Cre} — ABNORMAL HIGH (ref 0.00–0.15)
TOTAL PROTEIN, URINE: 41 mg/dL

## 2017-08-15 LAB — TYPE AND SCREEN
ABO/RH(D): A POS
ANTIBODY SCREEN: NEGATIVE

## 2017-08-15 MED ORDER — PHENYLEPHRINE 40 MCG/ML (10ML) SYRINGE FOR IV PUSH (FOR BLOOD PRESSURE SUPPORT)
80.0000 ug | PREFILLED_SYRINGE | INTRAVENOUS | Status: DC | PRN
  Filled 2017-08-15: qty 5

## 2017-08-15 MED ORDER — COCONUT OIL OIL: 1.0000 "application " | TOPICAL_OIL | Status: DC | PRN

## 2017-08-15 MED ORDER — ONDANSETRON HCL 4 MG PO TABS: 4.0000 mg | ORAL_TABLET | ORAL | Status: DC | PRN

## 2017-08-15 MED ORDER — FENTANYL 2.5 MCG/ML W/ROPIVACAINE 0.15% IN NS 100 ML EPIDURAL (ARMC)
12.0000 mL/h | EPIDURAL | Status: DC
  Administered 2017-08-15: 12 mL/h via EPIDURAL

## 2017-08-15 MED ORDER — BENZOCAINE-MENTHOL 20-0.5 % EX AERO: 1.0000 "application " | INHALATION_SPRAY | CUTANEOUS | Status: DC | PRN

## 2017-08-15 MED ORDER — ONDANSETRON HCL 4 MG/2ML IJ SOLN: 4.0000 mg | Freq: Four times a day (QID) | INTRAMUSCULAR | Status: DC | PRN

## 2017-08-15 MED ORDER — DIBUCAINE 1 % RE OINT: 1.0000 "application " | TOPICAL_OINTMENT | RECTAL | Status: DC | PRN

## 2017-08-15 MED ORDER — LIDOCAINE HCL (PF) 1 % IJ SOLN: 30.0000 mL | INTRAMUSCULAR | Status: DC | PRN

## 2017-08-15 MED ORDER — LACTATED RINGERS IV SOLN: 500.0000 mL | Freq: Once | INTRAVENOUS | Status: DC

## 2017-08-15 MED ORDER — ONDANSETRON HCL 4 MG/2ML IJ SOLN: 4.0000 mg | INTRAMUSCULAR | Status: DC | PRN

## 2017-08-15 MED ORDER — EPHEDRINE 5 MG/ML INJ
10.0000 mg | INTRAVENOUS | Status: DC | PRN
  Filled 2017-08-15: qty 2

## 2017-08-15 MED ORDER — SENNOSIDES-DOCUSATE SODIUM 8.6-50 MG PO TABS
2.0000 | ORAL_TABLET | ORAL | Status: DC
  Administered 2017-08-16: 2 via ORAL
  Filled 2017-08-15: qty 2

## 2017-08-15 MED ORDER — FENTANYL 2.5 MCG/ML W/ROPIVACAINE 0.15% IN NS 100 ML EPIDURAL (ARMC)
EPIDURAL | Status: DC | PRN
  Administered 2017-08-15: 10 mL/h via EPIDURAL

## 2017-08-15 MED ORDER — FENTANYL 2.5 MCG/ML W/ROPIVACAINE 0.15% IN NS 100 ML EPIDURAL (ARMC)
EPIDURAL | Status: AC
  Filled 2017-08-15: qty 100

## 2017-08-15 MED ORDER — SOD CITRATE-CITRIC ACID 500-334 MG/5ML PO SOLN: 30.0000 mL | ORAL | Status: DC | PRN

## 2017-08-15 MED ORDER — PRENATAL MULTIVITAMIN CH
1.0000 | ORAL_TABLET | Freq: Every day | ORAL | Status: DC
  Administered 2017-08-16: 1 via ORAL
  Filled 2017-08-15: qty 1

## 2017-08-15 MED ORDER — OXYTOCIN 10 UNIT/ML IJ SOLN: 10.0000 [IU] | Freq: Once | INTRAMUSCULAR | Status: DC

## 2017-08-15 MED ORDER — OXYTOCIN BOLUS FROM INFUSION
500.0000 mL | Freq: Once | INTRAVENOUS | Status: AC
  Administered 2017-08-15: 500 mL via INTRAVENOUS

## 2017-08-15 MED ORDER — LACTATED RINGERS IV SOLN
INTRAVENOUS | Status: DC
  Administered 2017-08-15: 16:00:00 via INTRAVENOUS

## 2017-08-15 MED ORDER — WITCH HAZEL-GLYCERIN EX PADS: 1.0000 "application " | MEDICATED_PAD | CUTANEOUS | Status: DC | PRN

## 2017-08-15 MED ORDER — OXYTOCIN 40 UNITS IN LACTATED RINGERS INFUSION - SIMPLE MED
2.5000 [IU]/h | INTRAVENOUS | Status: DC
  Administered 2017-08-15: 2.5 [IU]/h via INTRAVENOUS
  Filled 2017-08-15 (×2): qty 1000

## 2017-08-15 MED ORDER — DIPHENHYDRAMINE HCL 25 MG PO CAPS: 25.0000 mg | ORAL_CAPSULE | Freq: Four times a day (QID) | ORAL | Status: DC | PRN

## 2017-08-15 MED ORDER — LACTATED RINGERS IV SOLN
500.0000 mL | INTRAVENOUS | Status: DC | PRN
  Administered 2017-08-15: 1000 mL via INTRAVENOUS

## 2017-08-15 MED ORDER — SIMETHICONE 80 MG PO CHEW: 80.0000 mg | CHEWABLE_TABLET | ORAL | Status: DC | PRN

## 2017-08-15 MED ORDER — BUPIVACAINE HCL (PF) 0.25 % IJ SOLN
INTRAMUSCULAR | Status: DC | PRN
  Administered 2017-08-15: 5 mL via EPIDURAL

## 2017-08-15 MED ORDER — IBUPROFEN 600 MG PO TABS
600.0000 mg | ORAL_TABLET | Freq: Four times a day (QID) | ORAL | Status: DC
  Administered 2017-08-15 – 2017-08-17 (×6): 600 mg via ORAL
  Filled 2017-08-15 (×6): qty 1

## 2017-08-15 MED ORDER — ACETAMINOPHEN 325 MG PO TABS: 650.0000 mg | ORAL_TABLET | ORAL | Status: DC | PRN

## 2017-08-15 MED ORDER — DIPHENHYDRAMINE HCL 50 MG/ML IJ SOLN: 12.5000 mg | INTRAMUSCULAR | Status: DC | PRN

## 2017-08-15 MED ORDER — BUPRENORPHINE HCL 8 MG SL SUBL
8.0000 mg | SUBLINGUAL_TABLET | Freq: Two times a day (BID) | SUBLINGUAL | Status: DC
  Administered 2017-08-15 – 2017-08-17 (×4): 8 mg via SUBLINGUAL
  Filled 2017-08-15 (×4): qty 1

## 2017-08-15 MED ORDER — FERROUS SULFATE 325 (65 FE) MG PO TABS
325.0000 mg | ORAL_TABLET | Freq: Two times a day (BID) | ORAL | Status: DC
  Administered 2017-08-16 – 2017-08-17 (×3): 325 mg via ORAL
  Filled 2017-08-15 (×3): qty 1

## 2017-08-15 NOTE — Discharge Summary (Signed)
OB Discharge Summary     Patient Name: Isabella Owen DOB: [birth year] MRN: [id number]  Date of admission: 08/15/2017 Delivering MD: Thomasene Mohair, MD  Date of Delivery: 08/15/2017  Date of discharge: 08/17/2017  Admitting diagnosis: 39 wks;contractions, spontaneous rupture of membranes Intrauterine pregnancy: [redacted]w[redacted]d     Secondary diagnosis: substance abuse, on subutex     Discharge diagnosis: Term Pregnancy Delivered                                                                                                Post partum procedures:none  Augmentation: none  Complications: None  Hospital course:  Onset of Labor With Vaginal Delivery     31 y.o. yo G3P1011 at [redacted]w[redacted]d was admitted in Active Labor on 08/15/2017. Patient had an uncomplicated labor course as follows:  Membrane Rupture Time/Date: 12:30 PM ,08/15/2017   Intrapartum Procedures: Episiotomy: None [1]                                         Lacerations:  None [1]  Patient had a delivery of a Viable infant. 08/15/2017  Information for the patient's newborn:  Rhiannan, Kievit [[id number]]  Delivery Method: Vag-Spont    Pateint had an uncomplicated postpartum course other than some mild blood pressure elevations. She continues on Subutex 8 mgm BID.   She is ambulating, tolerating a regular diet, passing flatus, and urinating well. Patient is discharged home in stable condition on 08/17/17.   Physical exam  Vitals:   08/16/17 1635 08/16/17 1736 08/16/17 1959 08/17/17 0738  BP: (!) 142/95 136/89 135/88 (!) 138/91  Pulse: 66  64 64  Resp: Temp: 97.6 F (36.4 C)  97.9 F (36.6 C) 98.1 F (36.7 C)  TempSrc: Oral  Oral Oral  SpO2:   100% 99%   General: alert, cooperative and no distress Lochia: appropriate Uterine Fundus: firm at U-1 to U-2 Incision: N/A DVT Evaluation: No evidence of DVT seen on physical exam.  Labs: Lab Results  Component Value Date   WBC 16.2 (H) 08/16/2017   HGB 10.7 (L)  08/16/2017   HCT 31.0 (L) 08/16/2017   MCV 83.5 08/16/2017   PLT 290 08/16/2017    Discharge instruction: per After Visit Summary.  Medications:  Allergies as of 08/17/2017      Reactions   Ceclor [cefaclor] Rash      Medication List    STOP taking these medications   Doxylamine-Pyridoxine 10-10 MG Tbec Commonly known as:  DICLEGIS   ondansetron 8 MG disintegrating tablet Commonly known as:  ZOFRAN ODT     TAKE these medications   buprenorphine 8 MG Subl SL tablet Commonly known as:  SUBUTEX DIS 1 T UNT BID   ibuprofen 600 MG tablet Commonly known as:  ADVIL,MOTRIN Take 1 tablet (600 mg total) by mouth every 6 (six) hours as needed for mild pain, moderate pain or cramping.   multivitamin-prenatal 27-0.8 MG Tabs tablet Take 1 tablet by mouth daily at  12 noon.            Discharge Care Instructions        Start     Ordered   08/17/17 0000  ibuprofen (ADVIL,MOTRIN) 600 MG tablet  Every 6 hours PRN     08/17/17 0916      Diet: routine diet  Activity: Advance as tolerated. Pelvic rest for 6 weeks.   Outpatient follow up: Follow-up Information    Conard Novak, MD Follow up in 1 week(s).   Specialty:  Obstetrics and Gynecology Why:  blood pressure check and to order Nexplanon Contact information: [address] [place] [address] [phone]             Postpartum contraception: Nexplanon Rhogam Given postpartum: no Rubella vaccine given postpartum: no Varicella vaccine given postpartum: no TDaP given antepartum or postpartum: no last dose 2011  Newborn Data: Live born female MaeLee Birth Weight:  6#3.8oz APGAR: 8, 9   Baby Feeding: Breast  Disposition:NICU for observation of NAS  SIGNED: Farrel Conners, CNM

## 2017-08-15 NOTE — Anesthesia Preprocedure Evaluation (Signed)
Anesthesia Evaluation  Patient identified by MRN, date of birth, ID band Patient awake    Reviewed: Allergy & Precautions, NPO status , Patient's Chart, lab work & pertinent test results, reviewed documented beta blocker date and time   Airway Mallampati: II  TM Distance: >3 FB     Dental  (+) Chipped   Pulmonary former smoker,           Cardiovascular      Neuro/Psych    GI/Hepatic   Endo/Other    Renal/GU      Musculoskeletal   Abdominal   Peds  Hematology   Anesthesia Other Findings   Reproductive/Obstetrics                             Anesthesia Physical Anesthesia Plan  ASA: II  Anesthesia Plan: Epidural   Post-op Pain Management:    Induction:   PONV Risk Score and Plan:   Airway Management Planned:   Additional Equipment:   Intra-op Plan:   Post-operative Plan:   Informed Consent: I have reviewed the patients History and Physical, chart, labs and discussed the procedure including the risks, benefits and alternatives for the proposed anesthesia with the patient or authorized representative who has indicated his/her understanding and acceptance.     Plan Discussed with: CRNA  Anesthesia Plan Comments:         Anesthesia Quick Evaluation  

## 2017-08-15 NOTE — Anesthesia Procedure Notes (Signed)
Epidural Patient location during procedure: OB  Staffing Anesthesiologist: Landyn Lorincz Performed: anesthesiologist   Preanesthetic Checklist Completed: patient identified, site marked, surgical consent, pre-op evaluation, timeout performed, IV checked, risks and benefits discussed and monitors and equipment checked  Epidural Patient position: sitting Prep: Betadine Patient monitoring: heart rate, continuous pulse ox and blood pressure Approach: midline Location: L4-L5 Injection technique: LOR saline  Needle:  Needle type: Tuohy  Needle gauge: 18 G Needle length: 9 cm and 9 Catheter type: closed end flexible Catheter size: 20 Guage Test dose: negative and 1.5% lidocaine with Epi 1:200 K  Assessment Sensory level: T10 Events: blood not aspirated, injection not painful, no injection resistance, negative IV test and no paresthesia  Additional Notes   Patient tolerated the insertion well without complications.Reason for block:procedure for pain     

## 2017-08-15 NOTE — Clinical Social Work Note (Signed)
CSW received consult that patient has history of suboxone use. CSW will assess in the morning once able.  Argentina Ponder, MSW, Theresia Majors [phone]

## 2017-08-15 NOTE — H&P (Signed)
OB History & Physical   History of Present Illness:  Chief Complaint: water brok  HPI:  Isabella Owen is a 31 y.o. G59P1011 female at [redacted]w[redacted]d dated by LMP and 16 week ultrasound.  Her pregnancy has been complicated by history of substance abuse, was on suboxone and switched to subutex during pregnancy.    She reports contractions.   She reports leakage of fluid (clear gush at 1045AM today, clear and continues to leak).   She denies vaginal bleeding.   She reports fetal movement.   Denies headache, visual changes, and RUQ pain.   Maternal Medical History:   Past Medical History:  Diagnosis Date  . Irregular cardiac rhythm   . Substance abuse     Past Surgical History:  Procedure Laterality Date  . NO PAST SURGERIES      Allergies  Allergen Reactions  . Ceclor [Cefaclor] Rash    Prior to Admission medications   Medication Sig Start Date End Date Taking? Authorizing Provider  buprenorphine (SUBUTEX) 8 MG SUBL SL tablet DIS 1 T UNT BID 05/21/17  Yes [provider]  Doxylamine-Pyridoxine (DICLEGIS) 10-10 MG TBEC Take 2 tablets by mouth at bedtime. If symptoms persist, add one tablet in the morning and one in the afternoon 03/19/17  Yes Nadara Mustard, MD  ondansetron (ZOFRAN ODT) 8 MG disintegrating tablet Take 1 tablet (8 mg total) by mouth every 8 (eight) hours as needed for nausea or vomiting. 08/05/17  Yes Tresea Mall, CNM    OB History  Gravida Para Term Preterm AB Living  0 1 1  SAB TAB Ectopic Multiple Live Births  1            # Outcome Date GA Lbr Len/2nd Weight Sex Delivery Anes PTL Lv  3 Current           2 Term 01/15/10 [redacted]w[redacted]d  8 lb 3.2 oz (3.719 kg) M Vag-Spont     1 SAB               Prenatal care site: [facility]  Social History: She  reports that she has quit smoking. She has never used smokeless tobacco. She reports that she uses drugs, including Marijuana. She reports that she does not drink alcohol.  Family History: She denies  a family of gynecologic cancers  Review of Systems: Negative x 10 systems reviewed except as noted in the HPI.    Physical Exam:  Vital Signs: BP (!) 143/92   Pulse 66   Temp 97.9 F (36.6 C) (Oral)   Resp 20   LMP 11/17/2016  Constitutional: Well nourished, well developed female in no acute distress.  HEENT: normal Skin: Warm and dry.  Cardiovascular: Regular rate and rhythm.   Extremity: no edema  Respiratory: Clear to auscultation bilateral. Normal respiratory effort Abdomen: FHT present and gravid, NT (EFW 8 pounds by [place]) Back: no CVAT Neuro: DTRs 2+, Cranial nerves grossly intact Psych: Alert and Oriented x3. No memory deficits. Normal mood and affect.  MS: normal gait, normal bilateral lower extremity ROM/strength/stability.  Pelvic exam: 6cm, grossly ruptured per RN  Pertinent Results:  Prenatal Labs: Blood type/Rh A positive  Antibody screen negative  Rubella Immune  Varicella Immune    RPR NR  HBsAg negative  HIV negative  GC negative  Chlamydia negative  Genetic screening declined  1 hour GTT 104  3 hour GTT n/a  GBS negative on 07/29/17   Baseline FHR: 130 beats/min  Variability: moderate   Accelerations: present   Decelerations: present Contractions: present frequency: 3 q 10 min Overall assessment: cat 1  Assessment:  Isabella Owen is a 12 y.o. G65P1011 female at [redacted]w[redacted]d with SROM and active labor, history of drug abuse on subutex. She also has elevated blood pressures at admission.  Plan:  1. Admit to Labor & Delivery  2. CBC, T&S, Clrs, IVF 3. GBS negative.   4. Fetwal well-being: reassuring 5. Expectant management.  6. Elevated blood pressure: CBC, CMP, Urine protein/creatinine ratio.  If normal, expectant management.   Thomasene Mohair, MD 08/15/2017 2:56 PM

## 2017-08-16 LAB — CBC
HEMATOCRIT: 31 % — AB (ref 35.0–47.0)
HEMOGLOBIN: 10.7 g/dL — AB (ref 12.0–16.0)
MCH: 28.7 pg (ref 26.0–34.0)
MCHC: 34.4 g/dL (ref 32.0–36.0)
MCV: 83.5 fL (ref 80.0–100.0)
Platelets: 290 10*3/uL (ref 150–440)
RBC: 3.71 MIL/uL — AB (ref 3.80–5.20)
RDW: 13.5 % (ref 11.5–14.5)
WBC: 16.2 10*3/uL — ABNORMAL HIGH (ref 3.6–11.0)

## 2017-08-16 MED ORDER — TETANUS-DIPHTH-ACELL PERTUSSIS 5-2.5-18.5 LF-MCG/0.5 IM SUSP: 0.5000 mL | INTRAMUSCULAR | Status: DC | PRN

## 2017-08-16 MED ORDER — INFLUENZA VAC SPLIT HIGH-DOSE 0.5 ML IM SUSY: 0.5000 mL | PREFILLED_SYRINGE | INTRAMUSCULAR | Status: DC | PRN

## 2017-08-16 NOTE — Progress Notes (Signed)
Patient ID: Isabella Owen, female   DOB: 07/14/1986, 30 y.o.   MRN: [id number]  Obstetric Postpartum Daily Progress Note Subjective:  31 y.o. [id number] postpartum day #1 status post vaginal delivery.  She is ambulating, is tolerating po, is voiding spontaneously.  Her pain is well controlled on PO pain medications. Her lochia is less than menses.   Medications SCHEDULED MEDICATIONS  . buprenorphine  8 mg Sublingual BID  . ferrous sulfate  325 mg Oral BID WC  . ibuprofen  600 mg Oral Q6H  . prenatal multivitamin  1 tablet Oral Q1200  . senna-docusate  2 tablet Oral Q24H    MEDICATION INFUSIONS    PRN MEDICATIONS  acetaminophen, benzocaine-Menthol, coconut oil, witch hazel-glycerin **AND** dibucaine, diphenhydrAMINE, ondansetron **OR** ondansetron (ZOFRAN) IV, simethicone    Objective:   Vitals:   08/15/17 2047 08/15/17 2338 08/16/17 0337 08/16/17 0738  BP: (!) 141/81 136/83 134/77 127/77  Pulse: 77 71 68 65  Resp: Temp: 97.7 F (36.5 C) 98 F (36.7 C) 97.9 F (36.6 C) 98.4 F (36.9 C)  TempSrc: Oral Oral Oral Oral  SpO2: 99% 99% 100% 99%    Current Vital Signs 24h Vital Sign Ranges  T 98.4 F (36.9 C) Temp  Avg: 97.9 F (36.6 C)  Min: 97.3 F (36.3 C)  Max: 98.4 F (36.9 C)  BP 127/77 BP  Min: 98/82  Max: 158/72  HR 65 Pulse  Avg: 74  Min: 65  Max: 92  RR 17 Resp  Avg: 19  Min: 17  Max: 20  SaO2 99 % Not Delivered SpO2  Avg: 99.5 %  Min: 99 %  Max: 100 %       24 Hour I/O Current Shift I/O  Time Ins Outs 09/22 0701 - 09/23 0700 In: 1191.6 [I.V.:1191.6] Out: 444  No intake/output data recorded.  General: NAD Pulmonary: no increased work of breathing Abdomen: non-distended, non-tender, fundus firm at level of umbilicus Extremities: no edema, no erythema, no tenderness  Labs:   Recent Labs Lab 08/15/17 1446 08/16/17 0608  WBC 18.7* 16.2*  HGB 12.4 10.7*  HCT 36.1 31.0*  PLT 362 290     Assessment:   31 y.o. [id number] postpartum day # 1  status post SVD, doing well. Subutex use, +THC on admission  Plan:   1) Acute blood loss anemia - hemodynamically stable and asymptomatic - po ferrous sulfate  2) A POS / Rubella 2.87 (04/11 1429)/ Varicella Immune  3) TDAP status: Did not receive.  Will order prior to discharge. Flu vaccine: will also order prior to discharge.  4) breast feeding /Contraception = plans nexplanon  5) Disposition: home tomorrow  Thomasene Mohair, MD 08/16/2017 9:14 AM

## 2017-08-16 NOTE — Anesthesia Post-op Follow-up Note (Signed)
  Anesthesia Pain Follow-up Note  Patient: Isabella Owen [facility]  Day #: 1  Date of Follow-up: 08/16/2017 Time: 9:09 AM  Last Vitals:  Vitals:   08/16/17 0337 08/16/17 0738  BP: 134/77 127/77  Pulse: 68 65  Resp: 18 17  Temp: 36.6 C 36.9 C  SpO2: 100% 99%    Level of Consciousness: alert  Pain: none   Side Effects:None  Catheter Site Exam:clean, dry, no drainage     Plan: D/C from anesthesia care at surgeon's request  Marlana Salvage

## 2017-08-16 NOTE — Clinical Social Work Maternal (Signed)
  CLINICAL SOCIAL WORK MATERNAL/CHILD NOTE  Patient Details  Name: Isabella Owen MRN: [id number] Date of Birth: 1986-04-01  Date:  08/16/2017  Clinical Social Worker Initiating Note:  Santiago Bumpers, MSW, [place] Date/Time: Initiated:  08/16/17/1528     Child's Name:  Isabella Owen   Biological Parents:  Mother, Father   Need for Interpreter:  None   Reason for Referral:  Current Substance Use/Substance Use During Pregnancy    Address:  [address] [place] [address]    Phone number:  [phone] (home)     Additional phone number: N.A  Household Members/Support Persons (HM/SP):   Household Member/Support Person 1   HM/SP Name Relationship DOB or Age  HM/SP -[place] Mother Unknown  HM/SP -2        HM/SP -3        HM/SP -4        HM/SP -5        HM/SP -6        HM/SP -7        HM/SP -8          Natural Supports (not living in the home):  Spouse/significant other, Friends, Immediate Family, Neighbors, Extended Family, Radiographer, therapeutic Supports: Therapist   Employment: Unemployed   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   Education:  High school graduate   Homebound arranged:    Museum/gallery curator Resources:  Medicaid   Other Resources:  Physicist, medical , [place] Considerations Which May Impact Care:  None reported  Strengths:  Ability to meet basic needs , Compliance with medical plan , Home prepared for child , Understanding of illness, Psychotropic Medications   Psychotropic Medications:  Subutex      Pediatrician:       Pediatrician List:   Pharmacist, community      Pediatrician Fax Number: [phone]   Risk Factors/Current Problems:  Substance Use    Cognitive State:  Alert , Able to Concentrate , Insightful , Goal Oriented    Mood/Affect:  Calm , Comfortable , Relaxed    CSW Assessment: The CSW met with the patient and the  FOB at bedside. The patient reported that she wanted the FOB to remain in the room during the discussion, and she verbalized that she recognized the discussion would center around her UDS. The CSW informed the [name] that she had a positive UDS for cannabinoids as well as subutex; however, the infant was negative for all substances for her UDS. The CSW explained that the cord blood would be tested, and should that return positive for cannabinoids or any substance other than subutex, the CSW is mandated to contact CPS. The [name] verbalized understanding.  The [name] receives her harm reduction opioid treatment from [place]. The patient has a 30YO at home, and she and the FOB live together with the patient's mother. The patient has all basic needs met for the child and has chosen a pediatrician. The CSW will continue to follow should the cord blood return positive for substances.  CSW Plan/Description:  Information/Referral to [employer] , Patient/Family Education     Zettie Pho, [place] 08/16/2017, 3:31 PM

## 2017-08-16 NOTE — Progress Notes (Addendum)
Mom instructed in risks of Breast Feeding with Positive Marijuana use . She V/O and stated That she still will Breast feed. Mom also instructed in importance of Urine and Mec. Collection for Infant and for her to call me if Infant voids in U-Bag or if Infant passes stool. Mom v/o.

## 2017-08-16 NOTE — Anesthesia Postprocedure Evaluation (Signed)
Anesthesia Post Note  Patient: Isabella Owen  Procedure(s) Performed: * No procedures listed *  Patient location during evaluation: Mother Baby Anesthesia Type: Epidural Level of consciousness: awake and alert Pain management: pain level controlled Vital Signs Assessment: post-procedure vital signs reviewed and stable Respiratory status: spontaneous breathing, nonlabored ventilation and respiratory function stable Cardiovascular status: stable Postop Assessment: no headache, no backache, patient able to bend at knees, no apparent nausea or vomiting, adequate PO intake and epidural receding Anesthetic complications: no     Last Vitals:  Vitals:   08/16/17 0337 08/16/17 0738  BP: 134/77 127/77  Pulse: 68 65  Resp: 18 17  Temp: 36.6 C 36.9 C  SpO2: 100% 99%    Last Pain:  Vitals:   08/16/17 0822  TempSrc:   PainSc: 0-No pain                 Marlana Salvage

## 2017-08-17 LAB — RPR: RPR Ser Ql: NONREACTIVE

## 2017-08-17 MED ORDER — IBUPROFEN 600 MG PO TABS: 600.0000 mg | ORAL_TABLET | Freq: Four times a day (QID) | ORAL | 0 refills | Status: DC | PRN

## 2017-08-17 NOTE — Progress Notes (Signed)
Discharge teaching completed with mom.  Process explained on her now being a parent with the NB here for NAS scoring as a Pediatric patient.  Rx given for home use

## 2017-08-19 ENCOUNTER — Encounter: Payer: Medicaid Other | Admitting: Advanced Practice Midwife

## 2017-08-25 ENCOUNTER — Telehealth: Payer: Self-pay | Admitting: Obstetrics and Gynecology

## 2017-08-25 ENCOUNTER — Ambulatory Visit (INDEPENDENT_AMBULATORY_CARE_PROVIDER_SITE_OTHER): Payer: Medicaid Other | Admitting: Obstetrics and Gynecology

## 2017-08-25 NOTE — Progress Notes (Signed)
   SUBJECTIVE: 31 y.o. [name] female here for blood pressure check. She is post partum day #10 from an uncomplicated SVD. She had some mildly elevated blood pressures prior to discharge.  Denies issues with blood pressure. Denies HA, visual changes, RUQ pain. She also denies chest pain, shortness of breath.   OBJECTIVE:  BP 132/86   Ht  (1.702 m)   Wt 152 lb (68.9 kg)   LMP 11/17/2016   BMI 23.81 kg/m   Physical Exam  Constitutional: She appears well-developed and well-nourished. No distress.  HENT:  Head: Normocephalic and atraumatic.  Eyes: Conjunctivae are normal. No scleral icterus.  Cardiovascular: Normal rate and regular rhythm.  Exam reveals no friction rub.   No murmur heard. Pulmonary/Chest: Effort normal and breath sounds normal. No respiratory distress. She has no wheezes. She has no rales.  Musculoskeletal: She exhibits no edema or tenderness.  Psychiatric: She has a normal mood and affect. Her behavior is normal. Judgment normal.    ASSESSMENT AND PLAN: 31 y.o. [name] female postpartum day #10 s/p uncomplicated SVD with postpartum course complicated by elevated BPs.  No evidence of postpartum preeclampsia today.  -keep 6 week follow up appt. And Nexplanon placement.  Thomasene Mohair, MD 08/25/2017 1:59 PM

## 2017-08-25 NOTE — Telephone Encounter (Signed)
Patient is scheduled 10/30 with SDJ for nexplanon placement.

## 2017-08-28 NOTE — Telephone Encounter (Signed)
Noted. Will order to arrive by apt date/time. 

## 2017-09-22 ENCOUNTER — Ambulatory Visit (INDEPENDENT_AMBULATORY_CARE_PROVIDER_SITE_OTHER): Payer: Medicaid Other | Admitting: Obstetrics and Gynecology

## 2017-09-22 DIAGNOSIS — Z3049 Encounter for surveillance of other contraceptives: Secondary | ICD-10-CM | POA: Diagnosis not present

## 2017-09-22 DIAGNOSIS — Z30017 Encounter for initial prescription of implantable subdermal contraceptive: Secondary | ICD-10-CM

## 2017-09-22 MED ORDER — ETONOGESTREL 68 MG ~~LOC~~ IMPL: 68.0000 mg | DRUG_IMPLANT | Freq: Once | SUBCUTANEOUS | Status: DC

## 2017-09-22 NOTE — Progress Notes (Signed)
Postpartum Visit   Chief Complaint  Patient presents with  . 6 week post partum visit    History of Present Illness: Patient is a 31 y.o. [id number] presents for postpartum visit.  Date of delivery: 08/15/2017 Type of delivery: Vaginal delivery - Vacuum or forceps assisted  no Episiotomy yes Laceration: yes  Pregnancy or labor problems:  no Any problems since the delivery:  no  Newborn Details:  SINGLETON :  1. Baby's name: maelee. Birth weight: 6.4 Maternal Details:  Breast Feeding:  yes Post partum depression/anxiety noted:  no Edinburgh Post-Partum Depression Score:  2  Date of last PAP: 05/02/16, NILM  Past Medical History:  Diagnosis Date  . Irregular cardiac rhythm   . Substance abuse     Past Surgical History:  Procedure Laterality Date  . NO PAST SURGERIES      Prior to Admission medications   Medication Sig Start Date End Date Taking? Authorizing Provider  buprenorphine (SUBUTEX) 8 MG SUBL SL tablet DIS 1 T UNT BID 05/21/17   [provider]  ibuprofen (ADVIL,MOTRIN) 600 MG tablet Take 1 tablet (600 mg total) by mouth every 6 (six) hours as needed for mild pain, moderate pain or cramping. Patient not taking: Reported on 08/25/2017 08/17/17   Farrel Conners, CNM  Prenatal Vit-Fe Fumarate-FA (MULTIVITAMIN-PRENATAL) 27-0.8 MG TABS tablet Take 1 tablet by mouth daily at 12 noon.    [provider]    Allergies  Allergen Reactions  . Ceclor [Cefaclor] Rash     Social History   Social History  . Marital status: Single    Spouse name: N/A  . Number of children: N/A  . Years of education: N/A   Occupational History  . Not on file.   Social History Main Topics  . Smoking status: Former Games developer  . Smokeless tobacco: Never Used     Comment: denies cigarette use  . Alcohol use No     Comment: occassionally   . Drug use: Yes    Types: Marijuana     Comment: states "it might still be in my system"  . Sexual activity: Yes    Birth  control/ protection: None   Other Topics Concern  . Not on file   Social History Narrative  . No narrative on file   Family History: denies history of GYN cancer  Review of Systems  Constitutional: Negative.   HENT: Negative.   Eyes: Negative.   Respiratory: Negative.   Cardiovascular: Negative.   Gastrointestinal: Negative.   Genitourinary: Negative.   Musculoskeletal: Negative.   Skin: Negative.   Neurological: Negative.   Psychiatric/Behavioral: Negative.      Physical Exam BP 122/70   Ht 5\' 7"  (1.702 m)   Wt 164 lb (74.4 kg)   BMI 25.69 kg/m   Physical Exam  Constitutional: She is oriented to person, place, and time. She appears well-developed and well-nourished. No distress.  Genitourinary: Vagina normal and uterus normal. Pelvic exam was performed with patient supine. There is no rash, tenderness or lesion on the right labia. There is no rash, tenderness or lesion on the left labia. Vagina exhibits no lesion. No erythema, tenderness or bleeding in the vagina. No signs of injury around the vagina. No vaginal discharge found. Right adnexum does not display mass, does not display tenderness and does not display fullness. Left adnexum does not display mass, does not display tenderness and does not display fullness. Cervix does not exhibit motion tenderness, lesion or polyp.   Uterus is  mobile and anteverted. Uterus is not enlarged, tender, exhibiting a mass or irregular (is regular).  Eyes: EOM are normal. No scleral icterus.  Neck: Normal range of motion. Neck supple.  Cardiovascular: Normal rate and regular rhythm.   Pulmonary/Chest: Effort normal and breath sounds normal. No respiratory distress. She has no wheezes. She has no rales.  Abdominal: Soft. Bowel sounds are normal. She exhibits no distension and no mass. There is no tenderness. There is no rebound and no guarding.  Musculoskeletal: Normal range of motion. She exhibits no edema.  Neurological: She is alert and  oriented to person, place, and time. No cranial nerve deficit.  Skin: Skin is warm and dry. No erythema.  Psychiatric: She has a normal mood and affect. Her behavior is normal. Judgment normal.    Female Chaperone present during breast and/or pelvic exam.  GYNECOLOGY PROCEDURE NOTE  Patient is a 31 y.o. [id number] presenting for Nexplanon insertion as her desires means of contraception.  She provided informed consent, signed copy in the chart, time out was performed. Pregnancy test was negative, with self reported LMP of No LMP recorded.  She understands that Nexplanon is a progesterone only therapy, and that patients often patients have irregular and unpredictable vaginal bleeding or amenorrhea. She understands that other side effects are possible related to systemic progesterone, including but not limited to, headaches, breast tenderness, nausea, and irritability. While effective at preventing pregnancy long acting reversible contraceptives do not prevent transmission of sexually transmitted diseases and use of barrier methods for this purpose was discussed. The placement procedure for Nexplanon was reviewed with the patient in detail including risks of nerve injury, infection, bleeding and injury to other muscles or tendons. She understands that the Nexplanon implant is good for 3 years and needs to be removed at the end of that time.  She understands that Nexplanon is an extremely effective option for contraception, with failure rate of <1%. This information is reviewed today and all questions were answered. Informed consent was obtained, both verbally and written.   The patient is healthy and has no contraindications to Implanon use. Urine pregnancy test was performed today and was negative.  Procedure Appropriate time out taken.  Patient placed in dorsal supine with left arm above head, elbow flexed at 90 degrees, arm resting on examination table.  The bicipital grove was palpated and site 8-10cm  proximal to the medial epicondyle was indentified . The insertion site was prepped with a two betadine swabs and then injected with 3 cc of 1% lidocaine without epinephrine.  Nexplanon removed form sterile blister packaging,  Device confirmed in needle, before inserting full length of needle, tenting up the skin as the needle was advance.  The drug eluting rod was then deployed by pulling back the slider per the manufactures recommendation.  The implant was palpable by the clinician as well as the patient.  The insertion site covered dressed with a band aid before applying  a kerlex bandage pressure dressing..Minimal blood loss was noted during the procedure.  The patientt tolerated the procedure well.   She was instructed to wear the bandage for 24 hours, call with any signs of infection.  She was given the Nexplanon card and instructed to have the rod removed in 3 years.  Assessment: 32 y.o. [id number] presenting for 6 week postpartum visit  Plan: Problem List Items Addressed This Visit    Postpartum care following vaginal delivery - Primary    Other Visit Diagnoses    Nexplanon insertion  Relevant Medications   etonogestrel (NEXPLANON) implant 68 mg (Start on 09/22/2017  2:00 PM)     1) Contraception: Nexplanon placed.   2)  Pap - ASCCP guidelines and rational discussed.  Patient opts for routine screening interval  3) Patient underwent screening for postpartum depression with no concerns noted.  4) Follow up 1 year for routine annual exam  Thomasene MohairStephen Damauri Minion, MD 09/22/2017 1:58 PM

## 2018-10-06 ENCOUNTER — Encounter: Payer: Self-pay | Admitting: Obstetrics and Gynecology

## 2018-10-06 ENCOUNTER — Other Ambulatory Visit (HOSPITAL_COMMUNITY)
Admission: RE | Admit: 2018-10-06 | Discharge: 2018-10-06 | Disposition: A | Discharge status: Home / Self Care | Payer: Medicaid Other | Source: Ambulatory Visit | Attending: Obstetrics and Gynecology | Admitting: Obstetrics and Gynecology

## 2018-10-06 ENCOUNTER — Ambulatory Visit (INDEPENDENT_AMBULATORY_CARE_PROVIDER_SITE_OTHER): Payer: Medicaid Other | Admitting: Obstetrics and Gynecology

## 2018-10-06 VITALS — BP 137/81 | HR 64 | Ht 67.0 in | Wt 161.0 lb

## 2018-10-06 DIAGNOSIS — Z124 Encounter for screening for malignant neoplasm of cervix: Secondary | ICD-10-CM | POA: Diagnosis present

## 2018-10-06 DIAGNOSIS — Z1331 Encounter for screening for depression: Secondary | ICD-10-CM

## 2018-10-06 DIAGNOSIS — Z0000 Encounter for general adult medical examination without abnormal findings: Secondary | ICD-10-CM | POA: Diagnosis not present

## 2018-10-06 DIAGNOSIS — Z1339 Encounter for screening examination for other mental health and behavioral disorders: Secondary | ICD-10-CM

## 2018-10-06 DIAGNOSIS — Z113 Encounter for screening for infections with a predominantly sexual mode of transmission: Secondary | ICD-10-CM | POA: Diagnosis not present

## 2018-10-06 DIAGNOSIS — Z01419 Encounter for gynecological examination (general) (routine) without abnormal findings: Secondary | ICD-10-CM

## 2018-10-06 NOTE — Progress Notes (Signed)
Gynecology Annual Exam  PCP: Dortha KernBliss, Laura K, MD  Chief Complaint  Patient presents with  . Gynecologic Exam    History of Present Illness:  [name] is a 32 y.o. [phone] who LMP was No LMP recorded. Patient has had an implant., presents today for her annual examination.  She has no menses due to her implant.   She is single partner, contraception - Nexplanon.  Last Pap: 2 years  Results were: no abnormalities /neg HPV DNA not done Hx of STDs: none  There is no FH of breast cancer. There is no FH of ovarian cancer. The patient does do self-breast exams.  Tobacco use: The patient denies current or previous tobacco use. Alcohol use: social drinker Exercise: very active  The patient wears seatbelts: yes.   The patient reports that domestic violence in her life is absent.   She continues to breast feed. She has no breast issues today. She has mixed in some solid foods. The infant is doing well and is now ambulatory.   Past Medical History:  Diagnosis Date  . Irregular cardiac rhythm   . Substance abuse [facility](HCC)     Past Surgical History:  Procedure Laterality Date  . NO PAST SURGERIES      Prior to Admission medications   Medication Sig Start Date End Date Taking? Authorizing Provider  buprenorphine (SUBUTEX) 8 MG SUBL SL tablet DIS 1 T UNT BID 05/21/17  Yes [provider]  Prenatal Vit-Fe Fumarate-FA (MULTIVITAMIN-PRENATAL) 27-0.8 MG TABS tablet Take 1 tablet by mouth daily at 12 noon.   Yes [provider]    Allergies  Allergen Reactions  . Ceclor [Cefaclor] Rash   Obstetric History: [id number], s/p SVD x 2  Social History   Socioeconomic History  . Marital status: Single    Spouse name: Not on file  . Number of children: Not on file  . Years of education: Not on file  . Highest education level: Not on file  Occupational History  . Not on file  Social Needs  . Financial resource strain: Not on file  . Food insecurity:    Worry: Not  on file    Inability: Not on file  . Transportation needs:    Medical: Not on file    Non-medical: Not on file  Tobacco Use  . Smoking status: Former Games developermoker  . Smokeless tobacco: Never Used  . Tobacco comment: denies cigarette use  Substance and Sexual Activity  . Alcohol use: No    Comment: occassionally   . Drug use: Not Currently    Types: Marijuana    Comment: states "it might still be in my system"  . Sexual activity: Yes    Birth control/protection: None  Lifestyle  . Physical activity:    Days per week: Not on file    Minutes per session: Not on file  . Stress: Not on file  Relationships  . Social connections:    Talks on phone: Not on file    Gets together: Not on file    Attends religious service: Not on file    Active member of club or organization: Not on file    Attends meetings of clubs or organizations: Not on file    Relationship status: Not on file  . Intimate partner violence:    Fear of current or ex partner: Not on file    Emotionally abused: Not on file    Physically abused: Not on file    Forced  sexual activity: Not on file  Other Topics Concern  . Not on file  Social History Narrative  . Not on file   Family History: denies gynecologic cancer history.  Review of Systems  Constitutional: Negative.   HENT: Negative.   Eyes: Negative.   Respiratory: Negative.   Cardiovascular: Negative.   Gastrointestinal: Negative.   Genitourinary: Negative.   Musculoskeletal: Negative.   Skin: Negative.   Neurological: Negative.   Psychiatric/Behavioral: Negative.      Physical Exam BP 137/81 (BP Location: Left Arm, Patient Position: Sitting, Cuff Size: Normal)   Pulse 64   Ht 5\' 7"  (1.702 m)   Wt 161 lb (73 kg)   Breastfeeding? Yes   BMI 25.22 kg/m    Physical Exam  Constitutional: She is oriented to person, place, and time. She appears well-developed and well-nourished. No distress.  Genitourinary: Uterus normal. Pelvic exam was performed with  patient supine. There is no rash, tenderness, lesion or injury on the right labia. There is no rash, tenderness, lesion or injury on the left labia. No erythema, tenderness or bleeding in the vagina. No signs of injury around the vagina. No vaginal discharge found. Right adnexum does not display mass, does not display tenderness and does not display fullness. Left adnexum does not display mass, does not display tenderness and does not display fullness. Cervix does not exhibit motion tenderness, lesion, discharge or polyp.   Uterus is mobile and anteverted. Uterus is not enlarged, tender or exhibiting a mass.  HENT:  Head: Normocephalic and atraumatic.  Eyes: EOM are normal. No scleral icterus.  Neck: Normal range of motion. Neck supple. No thyromegaly present.  Cardiovascular: Normal rate and regular rhythm. Exam reveals no gallop and no friction rub.  No murmur heard. Pulmonary/Chest: Effort normal and breath sounds normal. No respiratory distress. She has no wheezes. She has no rales. Right breast exhibits no inverted nipple, no mass, no nipple discharge, no skin change and no tenderness. Left breast exhibits no inverted nipple, no mass, no nipple discharge, no skin change and no tenderness.  Abdominal: Soft. Bowel sounds are normal. She exhibits no distension and no mass. There is no tenderness. There is no rebound and no guarding.  Musculoskeletal: Normal range of motion. She exhibits no edema or tenderness.  Lymphadenopathy:    She has no cervical adenopathy.       Right: No inguinal adenopathy present.       Left: No inguinal adenopathy present.  Neurological: She is alert and oriented to person, place, and time. No cranial nerve deficit.  Skin: Skin is warm and dry. No rash noted. No erythema.  Psychiatric: She has a normal mood and affect. Her behavior is normal. Judgment normal.    Female chaperone present for pelvic and breast  portions of the physical exam  Assessment: 32 y.o.  G40P2012 female here for routine annual gynecologic examination  Plan: Problem List Items Addressed This Visit    None    Visit Diagnoses    Women's annual routine gynecological examination    -  Primary   Relevant Orders   Cytology - PAP   Screening for depression       Screening for alcoholism       Pap smear for cervical cancer screening       Relevant Orders   Cytology - PAP   Screen for STD (sexually transmitted disease)       Relevant Orders   Cytology - PAP  Screening: -- Blood pressure screen normal -- Weight screening: normal -- Depression screening negative (PHQ-9) -- Nutrition: normal -- cholesterol screening: not due for screening -- osteoporosis screening: not due -- tobacco screening: not using -- alcohol screening: AUDIT questionnaire indicates low-risk usage. -- family history of breast cancer screening: done. not at high risk. -- no evidence of domestic violence or intimate partner violence. -- STD screening: gonorrhea/chlamydia NAAT collected -- pap smear collected per ASCCP guidelines -- flu vaccine declined -- HPV vaccination series: not eligilbe  Thomasene Mohair, MD 10/06/2018 10:29 AM

## 2018-10-11 ENCOUNTER — Telehealth: Payer: Self-pay | Admitting: Obstetrics and Gynecology

## 2018-10-11 LAB — CYTOLOGY - PAP
Chlamydia: NEGATIVE
Diagnosis: UNDETERMINED — AB
HPV (WINDOPATH): DETECTED — AB
HPV 16/18/45 GENOTYPING: NEGATIVE
NEISSERIA GONORRHEA: NEGATIVE

## 2018-10-11 NOTE — Telephone Encounter (Signed)
Left generic VM 

## 2018-10-12 NOTE — Telephone Encounter (Signed)
Patient is calling for labs results. Please advise. 

## 2018-10-13 NOTE — Telephone Encounter (Signed)
Discussed ASCUS-HPV+ pap smear results. Discussed importance of colposcopy and risk of developing cancer of cervix, if not properly managed. She voiced understanding of recommendation and agreement to schedule colposcopy soon (within next month or so).

## 2018-10-13 NOTE — Telephone Encounter (Signed)
Please call patient today

## 2018-11-02 ENCOUNTER — Other Ambulatory Visit (HOSPITAL_COMMUNITY)
Admission: RE | Admit: 2018-11-02 | Discharge: 2018-11-02 | Disposition: A | Discharge status: Home / Self Care | Payer: Medicaid Other | Source: Ambulatory Visit | Attending: Obstetrics and Gynecology | Admitting: Obstetrics and Gynecology

## 2018-11-02 ENCOUNTER — Ambulatory Visit (INDEPENDENT_AMBULATORY_CARE_PROVIDER_SITE_OTHER): Payer: Medicaid Other | Admitting: Obstetrics and Gynecology

## 2018-11-02 ENCOUNTER — Encounter: Payer: Self-pay | Admitting: Obstetrics and Gynecology

## 2018-11-02 VITALS — BP 118/74 | Ht 67.0 in | Wt 155.0 lb

## 2018-11-02 DIAGNOSIS — D060 Carcinoma in situ of endocervix: Secondary | ICD-10-CM

## 2018-11-02 DIAGNOSIS — R87810 Cervical high risk human papillomavirus (HPV) DNA test positive: Secondary | ICD-10-CM

## 2018-11-02 DIAGNOSIS — R87610 Atypical squamous cells of undetermined significance on cytologic smear of cervix (ASC-US): Secondary | ICD-10-CM | POA: Insufficient documentation

## 2018-11-02 NOTE — Progress Notes (Signed)
HPI:  Isabella Owen is a 32 y.o.  [id number]  who presents today for evaluation and management of abnormal cervical cytology.    Dysplasia History:  ASCUS- HPV positive  OB History  Gravida Para Term Preterm AB Living  3 2 2  0 1 2  SAB TAB Ectopic Multiple Live Births  1     0 1    # Outcome Date GA Lbr Len/2nd Weight Sex Delivery Anes PTL Lv  3 Term 08/15/17 [redacted]w[redacted]d / 00:30 6 lb 3.8 oz (2.83 kg) F Vag-Spont EPI  LIV  2 Term 01/15/10 [redacted]w[redacted]d  8 lb 3.2 oz (3.719 kg) M Vag-Spont     1 SAB             Past Medical History:  Diagnosis Date  . Irregular cardiac rhythm   . Substance abuse [facility])     Past Surgical History:  Procedure Laterality Date  . NO PAST SURGERIES      SOCIAL HISTORY:  Social History   Substance and Sexual Activity  Alcohol Use No   Comment: occassionally     Social History   Substance and Sexual Activity  Drug Use Not Currently  . Types: Marijuana   Comment: states "it might still be in my system"     No family history on file.  ALLERGIES:  Ceclor [cefaclor]  Current Outpatient Medications on File Prior to Visit  Medication Sig Dispense Refill  . buprenorphine (SUBUTEX) 8 MG SUBL SL tablet DIS 1 T UNT BID  0  . Prenatal Vit-Fe Fumarate-FA (MULTIVITAMIN-PRENATAL) 27-0.8 MG TABS tablet Take 1 tablet by mouth daily at 12 noon.     Current Facility-Administered Medications on File Prior to Visit  Medication Dose Route Frequency Provider Last Rate Last Dose  . etonogestrel (NEXPLANON) implant 68 mg  68 mg Subdermal Once Conard Novak, MD        Physical Exam: -Vitals:  BP 118/74   Ht 5\' 7"  (1.702 m)   Wt 155 lb (70.3 kg)   BMI 24.28 kg/m  GEN: WD, WN, NAD.  A+ O x 3, good mood and affect. ABD:  NT, ND.  Soft, no masses.  No hernias noted.   Pelvic:   Vulva: Normal appearance.  No lesions.  Vagina: No lesions or abnormalities noted.  Support: Normal pelvic support.  Urethra No masses tenderness or scarring.  Meatus Normal  size without lesions or prolapse.  Cervix: See below.  Anus: Normal exam.  No lesions.  Perineum: Normal exam.  No lesions.        Bimanual   Uterus: Normal size.  Non-tender.  Mobile.  AV.  Adnexae: No masses.  Non-tender to palpation.  Cul-de-sac: Negative for abnormality.   PROCEDURE: 1.  Urine Pregnancy Test:  not done 2.  Colposcopy performed with 4% acetic acid after verbal consent obtained                                         -Aceto-white Lesions Location(s): 4, 6, 11 and 1 o'clock.              -Biopsy performed at 4, 6, 11 and 1 o'clock               -ECC indicated and performed: Yes.       -Biopsy sites made hemostatic with pressure, AgNO3, and/or Monsel's solution   -Satisfactory  colposcopy: No.    -Evidence of Invasive cervical CA :  NO  ASSESSMENT:  Isabella Owen is a 32 y.o. [id number] here for  1. ASCUS with positive high risk HPV cervical   .  PLAN:  I discussed the grading system of pap smears and HPV high risk viral types.  We will discuss and base management after colpo results return.     Thomasene MohairStephen Dorothee Napierkowski, MD  [facility], [facility] 11/02/2018  3:54 PM

## 2018-11-25 ENCOUNTER — Telehealth: Payer: Self-pay | Admitting: Obstetrics and Gynecology

## 2018-11-25 NOTE — Telephone Encounter (Signed)
Left generic VM 

## 2018-11-29 NOTE — Telephone Encounter (Signed)
Patient is calling for labs results. Please advise. 

## 2018-11-30 ENCOUNTER — Telehealth: Payer: Self-pay | Admitting: Obstetrics and Gynecology

## 2018-11-30 NOTE — Telephone Encounter (Signed)
-----   Message from Conard Novak, MD sent at 11/30/2018 10:21 AM EST ----- Regarding: Schedule Surgery Surgery Booking Request Patient Full Name:  Isabella Owen  MRN: [id number]  DOB: October 12, 1986  Surgeon: Thomasene Mohair, MD  Requested Surgery Date and Time: TBD Primary Diagnosis AND Code: CIN III Secondary Diagnosis and Code:  Surgical Procedure: in-office LEEP L&D Notification: No Admission Status: in-office procedure Length of Surgery: 45 minutes Special Case Needs: none H&P: n/a (date) Phone Interview???: no Interpreter: Language:  Medical Clearance: n/a Special Scheduling Instructions: none

## 2018-11-30 NOTE — Telephone Encounter (Signed)
Discussed findings of CIN II-III. Recommended LEEP procedure.  She understands her risk for developing cervical cancer is greatly reduced by this procedure and that she has a high risk for going on to develop cervical cancer, if she does not have the procedure done.  She voiced understanding and agreement to proceed with the procedure.   Thomasene Mohair, MD, [facility], [facility] 11/30/2018 10:20 AM

## 2018-11-30 NOTE — Telephone Encounter (Signed)
Lmtrc

## 2018-12-02 NOTE — Telephone Encounter (Signed)
Noted  

## 2018-12-02 NOTE — Telephone Encounter (Signed)
Patient is aware of in-office LEEP on 12/13/18 @ 8:30am w/ Dr Jean Rosenthal.

## 2018-12-13 ENCOUNTER — Ambulatory Visit (INDEPENDENT_AMBULATORY_CARE_PROVIDER_SITE_OTHER): Payer: Medicaid Other | Admitting: Obstetrics and Gynecology

## 2018-12-13 ENCOUNTER — Other Ambulatory Visit (HOSPITAL_COMMUNITY)
Admission: RE | Admit: 2018-12-13 | Discharge: 2018-12-13 | Disposition: A | Discharge status: Home / Self Care | Payer: Medicaid Other | Source: Ambulatory Visit | Attending: Obstetrics and Gynecology | Admitting: Obstetrics and Gynecology

## 2018-12-13 ENCOUNTER — Encounter: Payer: Self-pay | Admitting: Obstetrics and Gynecology

## 2018-12-13 VITALS — BP 118/74 | Ht 67.0 in | Wt 152.0 lb

## 2018-12-13 DIAGNOSIS — D069 Carcinoma in situ of cervix, unspecified: Secondary | ICD-10-CM | POA: Insufficient documentation

## 2018-12-13 DIAGNOSIS — N870 Mild cervical dysplasia: Secondary | ICD-10-CM | POA: Diagnosis not present

## 2018-12-13 MED ORDER — IBUPROFEN 800 MG PO TABS: 800.0000 mg | ORAL_TABLET | Freq: Three times a day (TID) | ORAL | 0 refills | Status: DC | PRN

## 2018-12-13 NOTE — Progress Notes (Signed)
  LEEP PROCEDURE NOTE  The LEEP has been explained to the patient in detail; risks/benefits reviewed.  The risks include, but are not limited to, bleeding, infection, and the possibility of cervical stenosis or cervical incompetence.  The patient had previously been given information regarding abnormal PAP smears and their relationship to HPV.  We have discussed the natural course and history of HPV, the possibility of incomplete treatment by the LEEP, as well as the possibility of recurrence.  I have reviewed the consent form for LEEP with her, and she fully understands its contents.  We have discussed the procedure itself. I have informed her that following the LEEP she should refrain from intercourse and the use of tampons for four weeks, and that she should also expect some spotting and brown/black discharge over the next several days.  We have discussed the fact that vaginal bleeding, differentiated from spotting, is not normal and that if she should have this complication, she should contact me immediately.  The follow-up after LEEP will be PAP smears or viral typing performed at regular intervals for up to 3-5 years.  Should these all prove to be normal, she will then be back on typical cervical screening.  I have answered all of her questions, and I believe she has an adequate understanding of the LEEP, its implications, and the necessity of follow-up care.  I discussed her colpo results and explained the procedure of LEEP.  All questions were answered and she signed the consent form.    LEEP performed in the usual manner after reviewing the previous colpo findings and results. Lugol's solution was usesd to identify any abnormal areas of the cervix.  The cervix was cleansed with betadine solution. Local injection of Lidocaine with epinephrine was performed for anesthesia. Ectocervical and then endocervical specimens obtained using the loop electrodes without difficulty.  Endocervical curettage was  also performed.  It was labeled accordingly. The base and edges of the defect was then cauterized using coagulation current.  Monsel's solution was then applied to assure continued hemostasis.   The patient tolerated the procedure well.    Female chaperone present for pelvic exam:   Thomasene Mohair, MD 12/13/2018 9:09 AM

## 2018-12-13 NOTE — Patient Instructions (Signed)

## 2018-12-17 ENCOUNTER — Telehealth: Payer: Self-pay | Admitting: Obstetrics and Gynecology

## 2018-12-17 NOTE — Telephone Encounter (Signed)
Patient is returning missed call from Dr. Jackson. Please advise °

## 2018-12-17 NOTE — Telephone Encounter (Signed)
Left generic vm 

## 2018-12-23 NOTE — Telephone Encounter (Signed)
Patient aware of CIN 1 results on LEEP specimen.  Recommended she keep her follow up and let her know that the longer-term follow up is a repeat pap smear in one year.  From a post-op standpoint, she is doing well and has no issues today.

## 2018-12-23 NOTE — Telephone Encounter (Signed)
Patient is calling to speak with Dr. Jean RosenthalJackson . Please advise

## 2019-01-07 ENCOUNTER — Ambulatory Visit (INDEPENDENT_AMBULATORY_CARE_PROVIDER_SITE_OTHER): Payer: Medicaid Other | Admitting: Obstetrics and Gynecology

## 2019-01-07 ENCOUNTER — Encounter: Payer: Self-pay | Admitting: Obstetrics and Gynecology

## 2019-01-07 VITALS — BP 118/76 | Ht 67.0 in | Wt 156.0 lb

## 2019-01-07 DIAGNOSIS — D069 Carcinoma in situ of cervix, unspecified: Secondary | ICD-10-CM

## 2019-01-07 DIAGNOSIS — Z9889 Other specified postprocedural states: Secondary | ICD-10-CM

## 2019-01-07 NOTE — Progress Notes (Signed)
   Postoperative Follow-up Patient presents post op from LEEP 4weeks ago for severe cervical dyplasia.  Subjective: Eating a regular diet without difficulty. The patient is not having any pain.  Activity: normal activities of daily living.  Objective: Vitals:   01/07/19 1542  BP: 118/76   Vital Signs: BP 118/76   Ht 5\' 7"  (1.702 m)   Wt 156 lb (70.8 kg)   BMI 24.43 kg/m  Constitutional: Well nourished, well developed female in no acute distress.  HEENT: normal Skin: Warm and dry.  Extremity: no edema  Abdomen: Soft, non-tender, normal bowel sounds; no bruits, organomegaly or masses.  Pelvic exam: (female chaperone present) is not limited by body habitus EGBUS: within normal limits Vagina: within normal limits and with normal mucosa blood in the vault Cervix: normal appearing. Well-healed LEEP cone bed.   Bimanual is confirmatory    Assessment: 33 y.o. s/p LEEP progressing well  Plan: Patient has done well after surgery with no apparent complications.  I have discussed the post-operative course to date, and the expected progress moving forward.  The patient understands what complications to be concerned about.  I will see the patient in routine follow up, or sooner if needed.    Activity plan: No restriction.  Follow up 1 year for repeat pap smear. Reiterated the importance of this exam and prevention of cervical cancer.   Thomasene Mohair, MD 01/07/2019, 3:54 PM

## 2019-11-25 HISTORY — PX: LEEP: SHX91

## 2020-08-02 ENCOUNTER — Telehealth: Payer: Self-pay | Admitting: Obstetrics and Gynecology

## 2020-08-02 NOTE — Telephone Encounter (Signed)
Patient is scheduled for nexplanon removal and reinsertion on 09/20/20 at 10:50 with ABC

## 2020-08-03 NOTE — Telephone Encounter (Signed)
Noted. Will order to arrive by apt date/time. 

## 2020-09-19 NOTE — Progress Notes (Addendum)
Chief Complaint  Patient presents with  . Contraception    Nexplanon replacement    HPI:  Isabella Owen is a 34 y.o. [id number] here for Nexplanon removal and insertion. Nexplanon placed 08/2017. Has irreg bleeding. Would like replacement. Isabella Owen is sex active, no pain/bleeding.  Hx of CIN 3 on colpo bx 12/19, s/p LEEP 1/20 with Dr. Jean Rosenthal, CIN 1 on path. Due for repeat pap today.  Past Medical History:  Diagnosis Date  . High grade squamous intraepithelial lesion (HGSIL), grade 3 CIN, on biopsy of cervix   . Irregular cardiac rhythm   . Substance abuse [facility])    Past Surgical History:  Procedure Laterality Date  . LEEP  11/2019   CIN 1   History reviewed. No pertinent family history.  Review of Systems  Constitutional: Negative for fever.  Gastrointestinal: Negative for blood in stool, constipation, diarrhea, nausea and vomiting.  Genitourinary: Negative for dyspareunia, dysuria, flank pain, frequency, hematuria, urgency, vaginal bleeding, vaginal discharge and vaginal pain.  Musculoskeletal: Negative for back pain.  Skin: Negative for rash.    BP 100/70   Ht 5\' 7"  (1.702 m)   Wt 139 lb (63 kg)   Breastfeeding Yes   BMI 21.77 kg/m   Physical Exam Constitutional:      General: Isabella Owen is not in acute distress. Genitourinary:     Vulva, vagina, cervix, uterus, right adnexa and left adnexa normal.     No vulval lesion, tenderness or ulcerations noted.     No vaginal discharge, erythema, tenderness or bleeding.     Uterus is not enlarged or tender.     No right or left adnexal mass present.     Right adnexa not tender.     Left adnexa not tender.  Pulmonary:     Effort: Pulmonary effort is normal.  Musculoskeletal:        General: Normal range of motion.  Neurological:     General: No focal deficit present.     Mental Status: Isabella Owen is alert.     Cranial Nerves: No cranial nerve deficit.  Skin:    General: Skin is warm and dry.  Psychiatric:        Mood and  Affect: Mood normal.        Behavior: Behavior normal.        Thought Content: Thought content normal.        Judgment: Judgment normal.  Vitals and nursing note reviewed.     Nexplanon removal Procedure note - The Nexplanon was noted in the patient's arm and the end was identified. The skin was cleansed with a Betadine solution. A small injection of subcutaneous lidocaine with epinephrine was given over the end of the implant. An incision was made at the end of the implant. The rod was noted in the incision and grasped with a hemostat. It was noted to be intact.  Steri-Strip was placed approximating the incision. Hemostasis was noted.  Nexplanon Insertion  Patient given informed consent, signed copy in the chart, time out was performed.  Appropriate time out taken.  Patient's LEFT arm was prepped and draped in the usual sterile fashion. The ruler used to measure and mark insertion area.  Pt was prepped with betadine swab and then injected with 1.0 cc of 2% lidocaine with epinephrine. Nexplanon removed form packaging,  Device confirmed in needle, then inserted full length of needle and withdrawn per handbook instructions.  Pt insertion site covered with steri-strip and a bandage.  Minimal blood loss.  Pt tolerated the procedure well.  Assessment: Encounter for removal and reinsertion of Nexplanon - Plan: etonogestrel (NEXPLANON) 68 MG IMPL implant  Cervical cancer screening - Plan: Cytology - PAP  Screening for HPV (human papillomavirus) - Plan: Cytology - PAP  High grade squamous intraepithelial lesion (HGSIL), grade 3 CIN, on biopsy of cervix - Plan: Cytology - PAP   Meds ordered this encounter  Medications  . etonogestrel (NEXPLANON) 68 MG IMPL implant    Sig: 1 each (68 mg total) by Subdermal route once for 1 dose.    Dispense:  1 each    Refill:  0    Order Specific Question:   Supervising Provider    Answer:   Nadara Mustard [[id number]]     Plan:   Isabella Owen was told to remove  the dressing in 12-24 hours, to keep the incision area dry for 24 hours and to remove the Steristrip in 2-3  days.  Notify us if any signs of tenderness, redness, pain, or fevers develop.   Kanani Mowbray B. Teaghan Melrose, PA-C 09/21/2020 9:33 AM

## 2020-09-20 ENCOUNTER — Ambulatory Visit (INDEPENDENT_AMBULATORY_CARE_PROVIDER_SITE_OTHER): Payer: Medicaid Other | Admitting: Obstetrics and Gynecology

## 2020-09-20 ENCOUNTER — Encounter: Payer: Self-pay | Admitting: Obstetrics and Gynecology

## 2020-09-20 ENCOUNTER — Other Ambulatory Visit (HOSPITAL_COMMUNITY)
Admission: RE | Admit: 2020-09-20 | Discharge: 2020-09-20 | Disposition: A | Discharge status: Home / Self Care | Payer: Medicaid Other | Source: Ambulatory Visit | Attending: Obstetrics and Gynecology | Admitting: Obstetrics and Gynecology

## 2020-09-20 ENCOUNTER — Other Ambulatory Visit: Payer: Self-pay

## 2020-09-20 VITALS — BP 100/70 | Ht 67.0 in | Wt 139.0 lb

## 2020-09-20 DIAGNOSIS — D069 Carcinoma in situ of cervix, unspecified: Secondary | ICD-10-CM

## 2020-09-20 DIAGNOSIS — Z124 Encounter for screening for malignant neoplasm of cervix: Secondary | ICD-10-CM

## 2020-09-20 DIAGNOSIS — Z1151 Encounter for screening for human papillomavirus (HPV): Secondary | ICD-10-CM | POA: Insufficient documentation

## 2020-09-20 DIAGNOSIS — Z3046 Encounter for surveillance of implantable subdermal contraceptive: Secondary | ICD-10-CM | POA: Diagnosis not present

## 2020-09-20 MED ORDER — ETONOGESTREL 68 MG ~~LOC~~ IMPL: 1.0000 | DRUG_IMPLANT | Freq: Once | SUBCUTANEOUS | 0 refills | Status: AC

## 2020-09-20 NOTE — Patient Instructions (Addendum)
I value your feedback and entrusting us with your care. If you get a [place] patient survey, I would appreciate you taking the time to let us know about your experience today. Thank you!  As of November 03, 2019, your lab results will be released to your MyChart immediately, before I even have a chance to see them. Please give me time to review them and contact you if there are any abnormalities. Thank you for your patience.   Remove the dressing in 24 hours,  keep the incision area dry for 24 hours and remove the Steristrip in 2-3  days.  Notify us if any signs of tenderness, redness, pain, or fevers develop.  

## 2020-09-21 ENCOUNTER — Encounter: Payer: Self-pay | Admitting: Obstetrics and Gynecology

## 2020-09-21 NOTE — Addendum Note (Signed)
Addended by: Althea Grimmer B on: 09/21/2020 09:38 AM   Modules accepted: Level of Service

## 2020-09-24 LAB — CYTOLOGY - PAP
Comment: NEGATIVE
Diagnosis: UNDETERMINED — AB
High risk HPV: NEGATIVE

## 2020-09-26 ENCOUNTER — Encounter: Payer: Self-pay | Admitting: Obstetrics and Gynecology

## 2021-09-17 NOTE — Telephone Encounter (Signed)
Nexplanon rcvd/charged 09/20/2020

## 2023-10-08 ENCOUNTER — Ambulatory Visit: Payer: Medicaid Other

## 2023-10-08 ENCOUNTER — Encounter: Payer: Self-pay | Admitting: Advanced Practice Midwife

## 2023-10-08 ENCOUNTER — Ambulatory Visit: Payer: Medicaid Other | Admitting: Advanced Practice Midwife

## 2023-10-08 VITALS — BP 123/82 | HR 67 | Ht 67.0 in | Wt 146.2 lb

## 2023-10-08 DIAGNOSIS — Z3009 Encounter for other general counseling and advice on contraception: Secondary | ICD-10-CM

## 2023-10-08 DIAGNOSIS — Z720 Tobacco use: Secondary | ICD-10-CM | POA: Diagnosis not present

## 2023-10-08 DIAGNOSIS — F1911 Other psychoactive substance abuse, in remission: Secondary | ICD-10-CM

## 2023-10-08 DIAGNOSIS — F32A Depression, unspecified: Secondary | ICD-10-CM | POA: Insufficient documentation

## 2023-10-08 DIAGNOSIS — N760 Acute vaginitis: Secondary | ICD-10-CM

## 2023-10-08 DIAGNOSIS — D069 Carcinoma in situ of cervix, unspecified: Secondary | ICD-10-CM

## 2023-10-08 DIAGNOSIS — Z309 Encounter for contraceptive management, unspecified: Secondary | ICD-10-CM | POA: Diagnosis not present

## 2023-10-08 DIAGNOSIS — Z3046 Encounter for surveillance of implantable subdermal contraceptive: Secondary | ICD-10-CM

## 2023-10-08 LAB — WET PREP FOR TRICH, YEAST, CLUE
Trichomonas Exam: NEGATIVE
Yeast Exam: NEGATIVE

## 2023-10-08 LAB — HEMOGLOBIN, FINGERSTICK: Hemoglobin: 13.4 g/dL (ref 11.1–15.9)

## 2023-10-08 MED ORDER — METRONIDAZOLE 500 MG PO TABS: 500.0000 mg | ORAL_TABLET | Freq: Two times a day (BID) | ORAL | Status: AC

## 2023-10-08 NOTE — Progress Notes (Signed)
[facility] [address] Main Number: [phone]   Family Planning Visit- Initial Visit  Subjective:  Isabella Owen is a 37 y.o. SWF exsmoker  [phone] (13,6)  being seen today for an initial annual visit and to discuss reproductive life planning.  The patient is currently using Hormonal Implant for pregnancy prevention. Patient reports   does not want a pregnancy in the next year.     report they are looking for a method that provides High efficacy at preventing pregnancy  Patient has the following medical conditions has History of substance abuse (HCC) and CIN III (cervical intraepithelial neoplasia grade III) with severe dysplasia on their problem list.  Chief Complaint  Patient presents with   Annual Exam    PE nex rem/insert    Patient reports here for physical, pap, Nexplanon removal/reinsertion. Nexplanon inserted [facility] 09/20/20 and effective for one more year. LMP "off and on". Last sex 03/2023 with condom; with current partner x 13 years. Receiving Suboxone IM 1x/month with counseling [facility]. Takes Remeron 15 mg with no formal psych dx but "it helps". Last cig 3 mo ago. Last MJ 3 mo ago. Last Percocet 10 years ago. Last ETOH 10 years ago. Not working. Living with her 2 kids. Last dental exam 10/2021. Highest grade completed 4 years college. Long hx abnormal paps: 10/06/18 ASCUS HPV+, colpo 10/2018 CIN 3, leep 11/2018 CIN 1, 09/20/20 ASCUS HPV- with no f/u since.   Patient denies vape, cigars  Body mass index is 22.9 kg/m. - Patient is eligible for diabetes screening based on BMI> 50 and age >35?  Pt declined HA1C ordered? Pt declined  Patient reports 1  partner/s in last year. Desires STI screening?  No - declines bloodwork  Has patient been screened once for HCV in the past?  No  No results found for: "HCVAB"  Does the patient have current drug use (including MJ), have a partner with drug use, and/or has  been incarcerated since last result? Yes  If yes-- Screen for HCV through [facility]   Does the patient meet criteria for HBV testing? Yes  Criteria:  -Household, sexual or needle sharing contact with HBV -History of drug use -HIV positive -Those with known Hep C   Health Maintenance Due  Topic Date Due   Hepatitis C Screening  Never done   DTaP/Tdap/Td (2 - Td or Tdap) 01/17/2020   INFLUENZA VACCINE  Never done   COVID-19 Vaccine (1 - 2023-24 season) Never done    Review of Systems  All other systems reviewed and are negative.   The following portions of the patient's history were reviewed and updated as appropriate: allergies, current medications, past family history, past medical history, past social history, past surgical history and problem list. Problem list updated.   See flowsheet for other program required questions.  Objective:   Vitals:   10/08/23 0906  BP: 123/82  Pulse: 67  Weight: 146 lb 3.2 oz (66.3 kg)  Height: 5\' 7"  (1.702 m)    Physical Exam Constitutional:      Appearance: Normal appearance. She is normal weight.  HENT:     Head: Normocephalic and atraumatic.     Mouth/Throat:     Mouth: Mucous membranes are moist.     Comments: Half teeth missing; last dental exam 10/2021 Eyes:     Conjunctiva/sclera: Conjunctivae normal.  Neck:     Thyroid: No thyroid mass, thyromegaly or thyroid tenderness.  Cardiovascular:     Rate and Rhythm: Normal rate and regular rhythm.  Pulmonary:     Effort: Pulmonary effort is normal.     Breath sounds: Normal breath sounds.  Chest:  Breasts:    Right: Normal.     Left: Normal.  Abdominal:     Palpations: Abdomen is soft.     Comments: Soft without masses or tenderness, fair tone  Genitourinary:    General: Normal vulva.     Exam position: Lithotomy position.     Pubic Area: No pubic lice.      Vagina: Vaginal discharge (light pink scant d/c, ph<4.5) present.     Cervix: Normal.     Uterus: Normal.       Adnexa: Right adnexa normal and left adnexa normal.     Rectum: Normal.     Comments: No evidence of nits Pap done Musculoskeletal:        General: Normal range of motion.     Cervical back: Normal range of motion and neck supple.  Skin:    General: Skin is warm and dry.  Neurological:     Mental Status: She is alert.  Psychiatric:        Mood and Affect: Mood normal.       Assessment and Plan:  Isabella Owen is a 37 y.o. female presenting to the [facility] Department for an initial annual wellness/contraceptive visit  Contraception counseling: Reviewed options based on patient desire and reproductive life plan. Patient is interested in Hormonal Implant. This is in situ provided to the patient today.  if not why not clearly documented  Risks, benefits, and typical effectiveness rates were reviewed.  Questions were answered.  Written information was also given to the patient to review.    The patient will follow up in  1 years for surveillance.  The patient was told to call with any further questions, or with any concerns about this method of contraception.  Emphasized use of condoms 100% of the time for STI prevention.  Educated on ECP and assessed for need of ECP. Patient reported last sex 03/2023 with condom.  Reviewed options and patient desired No method of ECP, declined all    1. Family planning Treat wet mount per standing orders Immunization nurse consult Please give pt dental list and encourage exam asap  - WET PREP FOR TRICH, YEAST, CLUE - Hemoglobin, venipuncture - Chlamydia/Gonorrhea [facility] - IGP, Aptima HPV  2. Encounter for surveillance of implantable subdermal contraceptive Nexplanon inserted [facility] 09/20/20 and effective for 1 more year  3. CIN III (cervical intraepithelial neoplasia grade III) with severe dysplasia Last pap 09/20/20 ASCUS HPV- No f/u since then Pap done today   No follow-ups on file.  No future  appointments.  Alberteen Spindle, CNM

## 2023-10-08 NOTE — Progress Notes (Signed)
 Pt is here for family planning visit.  Family planning packet reviewed and given to pt.  Wet prep results reviewed, treatment required per standing orders. Condoms declined. The patient was dispensed metronidazole today. I provided counseling today regarding the medication. We discussed the medication, the side effects and when to call clinic. Patient given the opportunity to ask questions. Questions answered.   Gaspar Garbe, RN

## 2023-10-09 ENCOUNTER — Telehealth: Payer: Self-pay

## 2023-10-14 LAB — IGP, APTIMA HPV
HPV Aptima: NEGATIVE
PAP Smear Comment: 0

## 2023-10-27 ENCOUNTER — Encounter: Payer: Self-pay | Admitting: Obstetrics and Gynecology

## 2023-10-27 ENCOUNTER — Ambulatory Visit (INDEPENDENT_AMBULATORY_CARE_PROVIDER_SITE_OTHER): Payer: Medicaid Other | Admitting: Obstetrics and Gynecology

## 2023-10-27 VITALS — BP 123/73 | HR 87 | Ht 67.0 in | Wt 147.0 lb

## 2023-10-27 DIAGNOSIS — Z3046 Encounter for surveillance of implantable subdermal contraceptive: Secondary | ICD-10-CM | POA: Diagnosis not present

## 2023-10-27 MED ORDER — ETONOGESTREL 68 MG ~~LOC~~ IMPL
68.0000 mg | DRUG_IMPLANT | Freq: Once | SUBCUTANEOUS | Status: AC
  Administered 2023-10-27: 68 mg via SUBCUTANEOUS

## 2023-10-27 NOTE — Patient Instructions (Signed)
I value your feedback and you entrusting [place] with your care. If you get a [place] patient survey, I would appreciate you taking the time to let us know about your experience today. Thank you!  Remove the dressing in 24 hours,  keep the incision area dry for 24 hours and remove the Steristrip in 2-3  days.  Notify us if any signs of tenderness, redness, pain, or fevers develop.

## 2023-10-27 NOTE — Progress Notes (Signed)
   Chief Complaint  Patient presents with   Contraception    Nexplanon replacement     HPI:  Isabella Owen is a 37 y.o. [id number] here for Nexplanon removal and insertion. Nexplanon replaced 09/20/20. Pt would like another one. Has occas light bleeding/spotting, no dysmen. Not currently sexually active.  Current on pap with [facility].  BP 123/73   Pulse 87   Ht 5\' 7"  (1.702 m)   Wt 147 lb (66.7 kg)   LMP 09/11/2023 (Exact Date)   Breastfeeding No   BMI 23.02 kg/m    Nexplanon removal Procedure note - The Nexplanon was noted in the patient's arm and the end was identified. The skin was cleansed with a Betadine solution. A small injection of subcutaneous lidocaine with epinephrine was given over the end of the implant. An incision was made at the end of the implant. The rod was noted in the incision and grasped with a hemostat. It was noted to be intact.  Steri-Strip was placed approximating the incision. Hemostasis was noted.  Nexplanon Insertion  Patient given informed consent, signed copy in the chart, time out was performed.  Appropriate time out taken.  Patient's LEFT arm was prepped and draped in the usual sterile fashion. The ruler used to measure and mark insertion area.  Pt was prepped with betadine swab and then injected with 1.0 cc of 2% lidocaine with epinephrine. Nexplanon removed form packaging,  Device confirmed in needle, then inserted full length of needle and withdrawn per handbook instructions.  Pt insertion site covered with steri-strip and a bandage.   Minimal blood loss.  Pt tolerated the procedure well.  Assessment: Encounter for removal and reinsertion of Nexplanon - Plan: etonogestrel (NEXPLANON) implant 68 mg   Meds ordered this encounter  Medications   etonogestrel (NEXPLANON) implant 68 mg     Plan:   She was told to remove the dressing in 12-24 hours, to keep the incision area dry for 24 hours and to remove the Steristrip in 2-3  days.  Notify us if  any signs of tenderness, redness, pain, or fevers develop.   Aanchal Cope B. Sanayah Munro, PA-C 10/27/2023 11:38 AM

## 2024-06-06 NOTE — Telephone Encounter (Signed)
 Error
